# Patient Record
Sex: Female | Born: 1975 | Race: Asian | Hispanic: No | Marital: Married | Smoking: Never smoker
Health system: Southern US, Community
[De-identification: ages and names within clinical notes are randomized; demographics above are authoritative.]

## PROBLEM LIST (undated history)

## (undated) ENCOUNTER — Ambulatory Visit

## (undated) ENCOUNTER — Encounter

## (undated) ENCOUNTER — Encounter: Attending: Diagnostic Radiology | Primary: Diagnostic Radiology

## (undated) ENCOUNTER — Telehealth

## (undated) ENCOUNTER — Ambulatory Visit: Attending: Infectious Disease | Primary: Infectious Disease

## (undated) ENCOUNTER — Telehealth
Attending: Student in an Organized Health Care Education/Training Program | Primary: Student in an Organized Health Care Education/Training Program

## (undated) ENCOUNTER — Ambulatory Visit: Payer: BLUE CROSS/BLUE SHIELD

## (undated) ENCOUNTER — Ambulatory Visit: Attending: Pharmacist | Primary: Pharmacist

## (undated) ENCOUNTER — Ambulatory Visit: Payer: PRIVATE HEALTH INSURANCE

## (undated) ENCOUNTER — Ambulatory Visit: Attending: Family | Primary: Family

## (undated) ENCOUNTER — Ambulatory Visit: Payer: Medicaid (Managed Care) | Attending: Obstetrics & Gynecology | Primary: Obstetrics & Gynecology

## (undated) ENCOUNTER — Encounter: Attending: Obstetrics & Gynecology | Primary: Obstetrics & Gynecology

## (undated) ENCOUNTER — Ambulatory Visit
Payer: BLUE CROSS/BLUE SHIELD | Attending: Student in an Organized Health Care Education/Training Program | Primary: Student in an Organized Health Care Education/Training Program

## (undated) ENCOUNTER — Encounter
Attending: Student in an Organized Health Care Education/Training Program | Primary: Student in an Organized Health Care Education/Training Program

## (undated) ENCOUNTER — Ambulatory Visit: Admission: EM | Payer: BLUE CROSS/BLUE SHIELD | Source: Home / Self Care

## (undated) DIAGNOSIS — B1910 Unspecified viral hepatitis B without hepatic coma: Secondary | ICD-10-CM

## (undated) HISTORY — DX: Unspecified viral hepatitis B without hepatic coma: B19.10

## (undated) HISTORY — PX: UTERINE FIBROID SURGERY: SHX826

---

## 2018-07-26 ENCOUNTER — Encounter: Payer: Self-pay | Admitting: Family Medicine

## 2018-07-26 ENCOUNTER — Ambulatory Visit (INDEPENDENT_AMBULATORY_CARE_PROVIDER_SITE_OTHER): Payer: BLUE CROSS/BLUE SHIELD | Admitting: Family Medicine

## 2018-07-26 ENCOUNTER — Other Ambulatory Visit: Payer: Self-pay

## 2018-07-26 VITALS — BP 100/70 | HR 81 | Temp 98.1°F | Resp 16 | Ht 60.0 in | Wt 115.1 lb

## 2018-07-26 DIAGNOSIS — B181 Chronic viral hepatitis B without delta-agent: Secondary | ICD-10-CM

## 2018-07-26 MED ORDER — ADEFOVIR DIPIVOXIL 10 MG PO TABS: 10.0000 mg | ORAL_TABLET | Freq: Every day | ORAL | 0 refills | Status: DC

## 2018-07-26 NOTE — Progress Notes (Signed)
New Patient Office Visit  Subjective:  Patient ID: Tina Fernandez, female    DOB: 13-Sep-1975  Age: 43 y.o. MRN: [id number]  CC:  Chief Complaint  Patient presents with  . Establish Care  . Hepatitis B    see a Dr. in [place]     HPI Tina Fernandez presents for establish care.   Hep B: She thinks she had Hep B from her mother at birth.  Has been in the [name] for 26 years, originally from [place].  She has been under treatment for Hep B since coming to the [name].  Was seeing her PCP in [place] for treatment - currently taking Hepsera 10mg  daily.  She denies any history of complications with her disease, no abdominal pain, no history or current symptoms of scleral icterus or skin discoloration.    Health Maintenance: We will check with her prior PCP before initiating any additional interventions.   Past Medical History:  Diagnosis Date  . Hepatitis B     Past Surgical History:  Procedure Laterality Date  . UTERINE FIBROID SURGERY      History reviewed. No pertinent family history.  Social History   Socioeconomic History  . Marital status: Married    Spouse name: [name]  . Number of children: 2  . Years of education: Not on file  . Highest education level: Not on file  Occupational History  . Occupation: Self Employed    Comment: Own [employer]  Social Needs  . Financial resource strain: Not hard at all  . Food insecurity:    Worry: Never true    Inability: Never true  . Transportation needs:    Medical: No    Non-medical: No  Tobacco Use  . Smoking status: Never Smoker  . Smokeless tobacco: Never Used  Substance and Sexual Activity  . Alcohol use: Never    Frequency: Never  . Drug use: Never  . Sexual activity: Yes    Partners: Female    Birth control/protection: None  Lifestyle  . Physical activity:    Days per week: 0 days    Minutes per session: 0 [name]  . Stress: Not at all  Relationships  . Social connections:    Talks on phone: More than three times a week   Gets together: More than three times a week    Attends religious service: 1 to 4 times per year    Active member of club or organization: No    Attends meetings of clubs or organizations: Never    Relationship status: Married  . Intimate partner violence:    Fear of current or ex partner: No    Emotionally abused: No    Physically abused: No    Forced sexual activity: No  Other Topics Concern  . Not on file  Social History Narrative  . Not on file    ROS Review of Systems Constitutional: Negative for fever or weight change.  Respiratory: Negative for cough and shortness of breath.   Cardiovascular: Negative for chest pain or palpitations.  Gastrointestinal: Negative for abdominal pain, no bowel changes.  Musculoskeletal: Negative for gait problem or joint swelling.  Skin: Negative for rash.  Neurological: Negative for dizziness or headache.  No other specific complaints in a complete review of systems (except as listed in HPI above).   Objective:   Today's Vitals: BP 100/70 (BP Location: Right Arm, Patient Position: Sitting, Cuff Size: Normal)   Pulse 81   Temp 98.1 F (36.7  C) (Oral)   Resp 16   Ht 5' (1.524 m)   Wt 115 lb 1.6 oz (52.2 kg)   SpO2 99%   BMI 22.48 kg/m   Physical Exam Vitals signs and nursing note reviewed.  Constitutional:      General: She is not in acute distress.    Appearance: She is well-developed.  HENT:     Head: Normocephalic and atraumatic.     Right Ear: External ear normal.     Left Ear: External ear normal.     Nose: Nose normal.     Mouth/Throat:     Pharynx: No oropharyngeal exudate.  Eyes:     Conjunctiva/sclera: Conjunctivae normal.     Pupils: Pupils are equal, round, and reactive to light.  Neck:     Musculoskeletal: Normal range of motion and neck supple.     Vascular: No JVD.  Cardiovascular:     Rate and Rhythm: Normal rate and regular rhythm.     Heart sounds: Normal heart sounds.  Pulmonary:     Effort: Pulmonary  effort is normal.     Breath sounds: Normal breath sounds.  Abdominal:     General: Bowel sounds are normal.     Palpations: Abdomen is soft. There is no hepatomegaly, splenomegaly or mass.     Tenderness: There is no abdominal tenderness.  Musculoskeletal: Normal range of motion.        General: No tenderness.  Skin:    General: Skin is warm and dry.     Findings: No rash.  Neurological:     Mental Status: She is alert and oriented to person, place, and time.     Cranial Nerves: No cranial nerve deficit.  Psychiatric:        Behavior: Behavior normal.        Thought Content: Thought content normal.        Judgment: Judgment normal.    Assessment & Plan:   Problem List Items Addressed This Visit      Digestive   Chronic hepatitis B (HCC) - Primary    We will obtain records/labs and determine course from there.  May need GI referral.      Relevant Medications   adefovir (HEPSERA) 10 MG tablet      Outpatient Encounter Medications as of 07/26/2018  Medication Sig  . adefovir (HEPSERA) 10 MG tablet Take 1 tablet (10 mg total) by mouth daily.  . [DISCONTINUED] adefovir (HEPSERA) 10 MG tablet   . [DISCONTINUED] adefovir (HEPSERA) 10 MG tablet Take 1 tablet (10 mg total) by mouth daily.   No facility-administered encounter medications on file as of 07/26/2018.    Follow-up: Return in about 3 months (around 10/24/2018).   Doren Custard, FNP

## 2018-07-26 NOTE — Assessment & Plan Note (Addendum)
We will obtain records/labs and determine course from there.  May need GI referral.  States is due for labs, but will await PCP update to know which labs she is due for.

## 2018-08-09 ENCOUNTER — Telehealth: Payer: Self-pay | Admitting: Family Medicine

## 2018-08-09 ENCOUNTER — Encounter: Payer: Self-pay | Admitting: Family Medicine

## 2018-08-09 DIAGNOSIS — B181 Chronic viral hepatitis B without delta-agent: Secondary | ICD-10-CM

## 2018-08-09 NOTE — Telephone Encounter (Signed)
Please let Tina Fernandez know that I received her records, and I have reviewed them.  I recommend she see the GI doctor for evaluation to ensure she has the proper follow up on her chronic Hep B.  If they clear her for primary care follow up, I am happy to see her ongoing.

## 2018-08-09 NOTE — Telephone Encounter (Signed)
I tired to contact this patient to review Emily's message but there was no answer. A message was left on her voicemail and I asked her to give our office a call if she had any additional questions.

## 2018-08-12 DIAGNOSIS — B181 Chronic viral hepatitis B without delta-agent: Principal | ICD-10-CM

## 2018-08-27 ENCOUNTER — Other Ambulatory Visit: Payer: Self-pay | Admitting: Emergency Medicine

## 2018-08-27 DIAGNOSIS — B181 Chronic viral hepatitis B without delta-agent: Secondary | ICD-10-CM

## 2018-08-27 MED ORDER — ADEFOVIR DIPIVOXIL 10 MG PO TABS: 10.0000 mg | ORAL_TABLET | Freq: Every day | ORAL | 0 refills | Status: DC

## 2018-08-27 NOTE — Telephone Encounter (Signed)
Please check on GI referral.

## 2018-09-29 ENCOUNTER — Telehealth: Payer: Self-pay | Admitting: Emergency Medicine

## 2018-09-29 DIAGNOSIS — B181 Chronic viral hepatitis B without delta-agent: Secondary | ICD-10-CM

## 2018-09-29 MED ORDER — ADEFOVIR DIPIVOXIL 10 MG PO TABS: 10.0000 mg | ORAL_TABLET | Freq: Every day | ORAL | 0 refills | Status: AC

## 2018-09-29 NOTE — Telephone Encounter (Signed)
Spoke to [place] at Referral

## 2018-09-29 NOTE — Telephone Encounter (Signed)
Rx sent, but needs GI referral for evaluation

## 2018-10-22 ENCOUNTER — Other Ambulatory Visit: Payer: Self-pay | Admitting: Internal Medicine

## 2018-10-22 ENCOUNTER — Other Ambulatory Visit: Payer: Self-pay

## 2018-10-22 DIAGNOSIS — B181 Chronic viral hepatitis B without delta-agent: Secondary | ICD-10-CM

## 2018-10-24 NOTE — Telephone Encounter (Signed)
Pt was referred to GI and was sent to Dr. Markham Jordan.  Please contact his office to find out if they are taking over care of this medication.  Please also request their most recent visit with her. Thanks!

## 2018-11-01 NOTE — Telephone Encounter (Signed)
Please check on referral for this patient

## 2018-11-02 NOTE — Telephone Encounter (Signed)
She saw Dr. Norma Fredrickson on 10/21/2018. Please review records in [facility]

## 2018-11-08 ENCOUNTER — Ambulatory Visit: Payer: BLUE CROSS/BLUE SHIELD

## 2019-09-05 ENCOUNTER — Ambulatory Visit: Payer: BLUE CROSS/BLUE SHIELD | Attending: Internal Medicine

## 2019-09-05 DIAGNOSIS — Z23 Encounter for immunization: Secondary | ICD-10-CM

## 2019-09-05 NOTE — Progress Notes (Signed)
   Covid-19 Vaccination Clinic  Name:  Tina Fernandez    MRN: [id number] DOB: Oct 19, 1975  09/05/2019  [name] was observed post Covid-19 immunization for 15 minutes without incident. She was provided with Vaccine Information Sheet and instruction to access the V-Safe system.   [name] was instructed to call 911 with any severe reactions post vaccine: Marland Kitchen Difficulty breathing  . Swelling of face and throat  . A fast heartbeat  . A bad rash all over body  . Dizziness and weakness   Immunizations Administered    Name Date Dose VIS Date Route   Pfizer COVID-19 Vaccine 09/05/2019  2:38 PM 0.3 mL 05/13/2019 Intramuscular   Manufacturer: [employer], [employer]   Lot: [id number]   NDC: [address]-2

## 2019-09-13 ENCOUNTER — Other Ambulatory Visit: Payer: Self-pay | Admitting: Infectious Diseases

## 2019-09-13 DIAGNOSIS — B181 Chronic viral hepatitis B without delta-agent: Secondary | ICD-10-CM

## 2019-09-20 ENCOUNTER — Ambulatory Visit
Admission: RE | Admit: 2019-09-20 | Discharge: 2019-09-20 | Disposition: A | Discharge status: Home / Self Care | Payer: BLUE CROSS/BLUE SHIELD | Source: Ambulatory Visit | Attending: Infectious Diseases | Admitting: Infectious Diseases

## 2019-09-20 ENCOUNTER — Other Ambulatory Visit: Payer: Self-pay

## 2019-09-20 DIAGNOSIS — B181 Chronic viral hepatitis B without delta-agent: Secondary | ICD-10-CM | POA: Insufficient documentation

## 2019-09-28 ENCOUNTER — Ambulatory Visit: Payer: BLUE CROSS/BLUE SHIELD | Attending: Internal Medicine

## 2019-09-28 DIAGNOSIS — Z23 Encounter for immunization: Secondary | ICD-10-CM

## 2019-09-28 NOTE — Progress Notes (Signed)
   Covid-19 Vaccination Clinic  Name:  Grecia Lynk    MRN: [id number] DOB: 09/10/1975  09/28/2019  Ms. Timme was observed post Covid-19 immunization for 15 minutes without incident. She was provided with Vaccine Information Sheet and instruction to access the V-Safe system.   Ms. Bound was instructed to call 911 with any severe reactions post vaccine: Marland Kitchen Difficulty breathing  . Swelling of face and throat  . A fast heartbeat  . A bad rash all over body  . Dizziness and weakness   Immunizations Administered    Name Date Dose VIS Date Route   Pfizer COVID-19 Vaccine 09/28/2019 11:02 AM 0.3 mL 07/27/2018 Intramuscular   Manufacturer: [employer], [employer]   Lot: [id number]   NDC: [address]-8

## 2020-01-13 ENCOUNTER — Encounter
Admit: 2020-01-13 | Discharge: 2020-01-14 | Payer: PRIVATE HEALTH INSURANCE | Attending: Dermatology | Primary: Dermatology

## 2020-01-13 DIAGNOSIS — L309 Dermatitis, unspecified: Principal | ICD-10-CM

## 2020-01-13 MED ORDER — TRIAMCINOLONE ACETONIDE 0.1 % TOPICAL CREAM
Freq: Two times a day (BID) | TOPICAL | 1 refills | 0.00000 days | Status: CP
Start: 2020-01-13 — End: ?

## 2020-01-17 DIAGNOSIS — L209 Atopic dermatitis, unspecified: Principal | ICD-10-CM

## 2020-06-11 ENCOUNTER — Ambulatory Visit: Payer: BLUE CROSS/BLUE SHIELD | Attending: Internal Medicine

## 2020-06-11 DIAGNOSIS — Z23 Encounter for immunization: Secondary | ICD-10-CM

## 2020-06-11 NOTE — Progress Notes (Signed)
   Covid-19 Vaccination Clinic  Name:  Tina Fernandez    MRN: [id number] DOB: 01/28/1976  06/11/2020  [name] was observed post Covid-19 immunization for 15 minutes without incident. She was provided with Vaccine Information Sheet and instruction to access the V-Safe system.   [name] was instructed to call 911 with any severe reactions post vaccine: Marland Kitchen Difficulty breathing  . Swelling of face and throat  . A fast heartbeat  . A bad rash all over body  . Dizziness and weakness   Immunizations Administered    Name Date Dose VIS Date Route   Pfizer COVID-19 Vaccine 06/11/2020  1:26 PM 0.3 mL 03/21/2020 Intramuscular   Manufacturer: [employer], [employer]   Lot: [id number]   NDC: [address]-4

## 2021-11-15 ENCOUNTER — Ambulatory Visit: Admit: 2021-11-15 | Discharge: 2021-11-16 | Payer: PRIVATE HEALTH INSURANCE

## 2021-11-15 DIAGNOSIS — N926 Irregular menstruation, unspecified: Principal | ICD-10-CM

## 2021-11-15 DIAGNOSIS — B181 Chronic viral hepatitis B without delta-agent: Principal | ICD-10-CM

## 2021-11-15 MED ORDER — ADEFOVIR 10 MG TABLET
ORAL_TABLET | Freq: Every day | ORAL | 2 refills | 90 days | Status: CP
Start: 2021-11-15 — End: ?

## 2021-11-26 DIAGNOSIS — B181 Chronic viral hepatitis B without delta-agent: Principal | ICD-10-CM

## 2021-11-26 MED ORDER — ADEFOVIR 10 MG TABLET
ORAL_TABLET | Freq: Every day | ORAL | 2 refills | 90 days | Status: CP
Start: 2021-11-26 — End: ?

## 2021-12-10 IMAGING — US US ABDOMEN LIMITED
1 series · 14 of 25 positions shown · non-contrast
Comparison: No pertinent prior studies available for comparison.

CLINICAL DATA: Chronic hepatitis B (HCC)

EXAM:
ULTRASOUND ABDOMEN LIMITED RIGHT UPPER QUADRANT

[Series 1: us abdomen limited · 0.15mm/px · 14 of 44 slices shown]
[im 1/44]
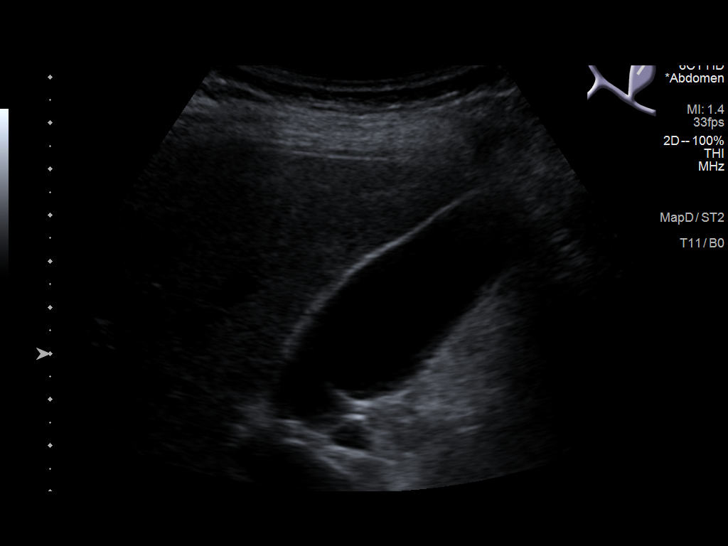
[im 4/44]
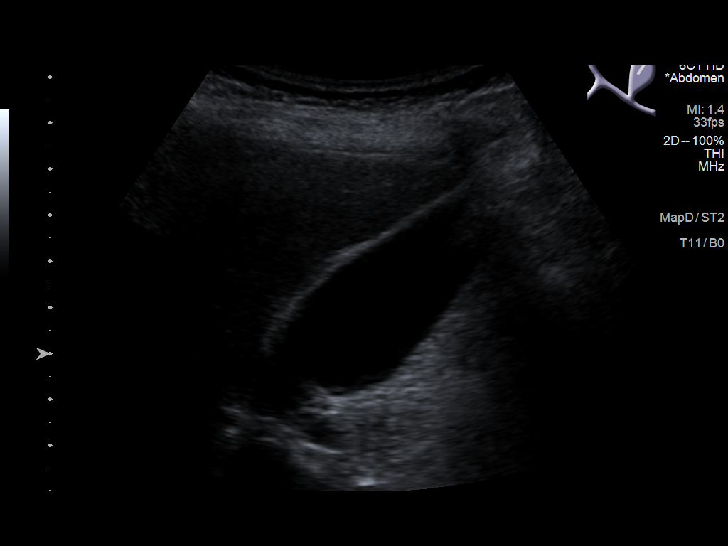
[im 8/44]
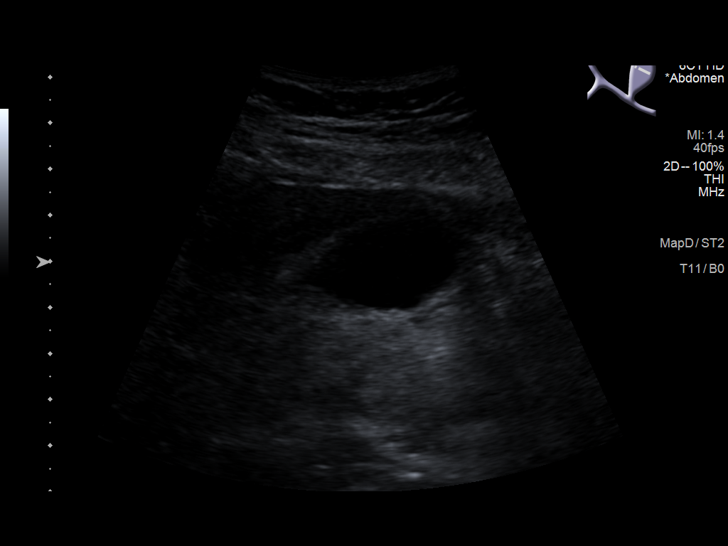
[im 11/44]
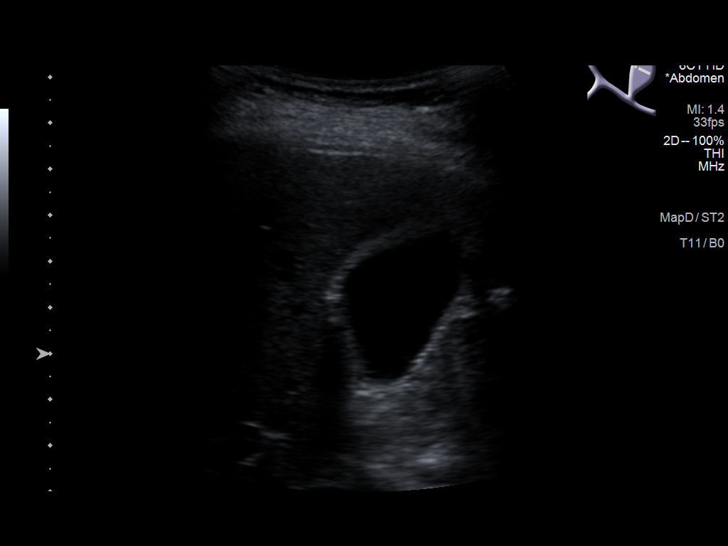
[im 15/44]
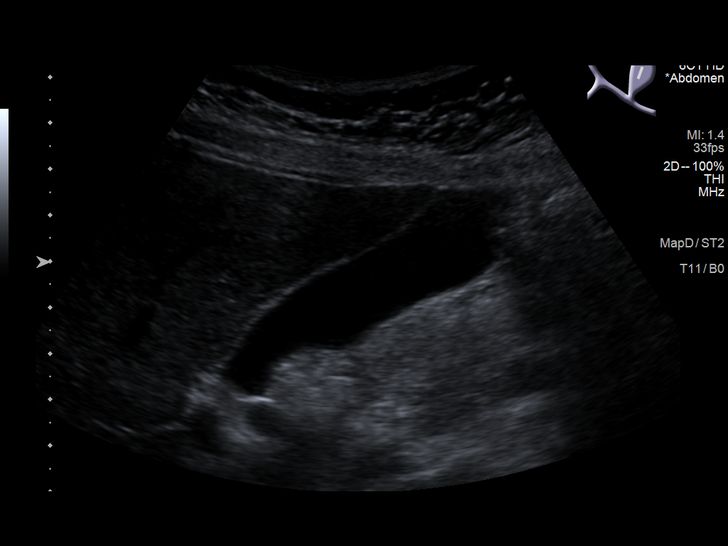
[im 17/44]
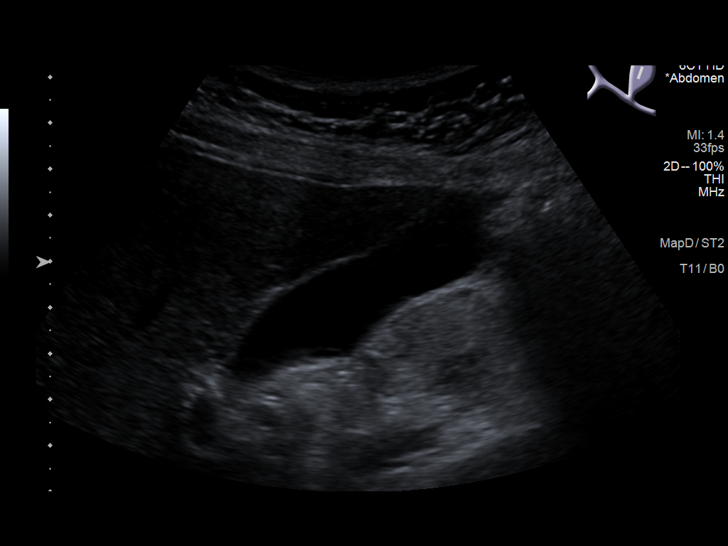
[im 20/44]
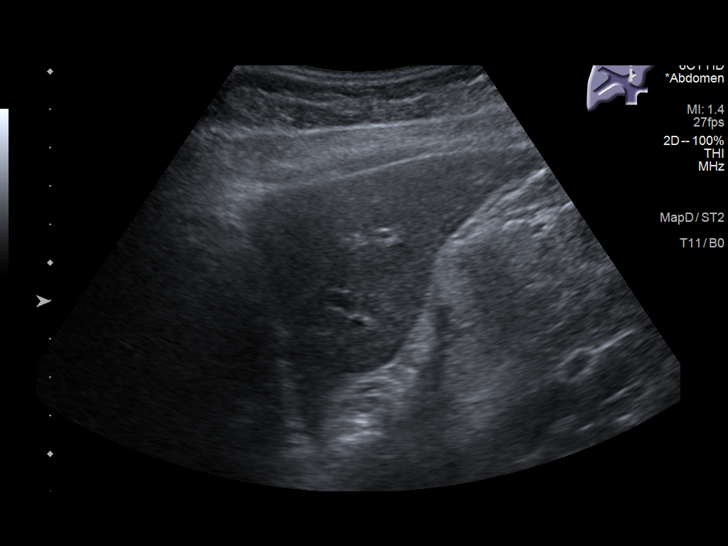
[im 24/44]
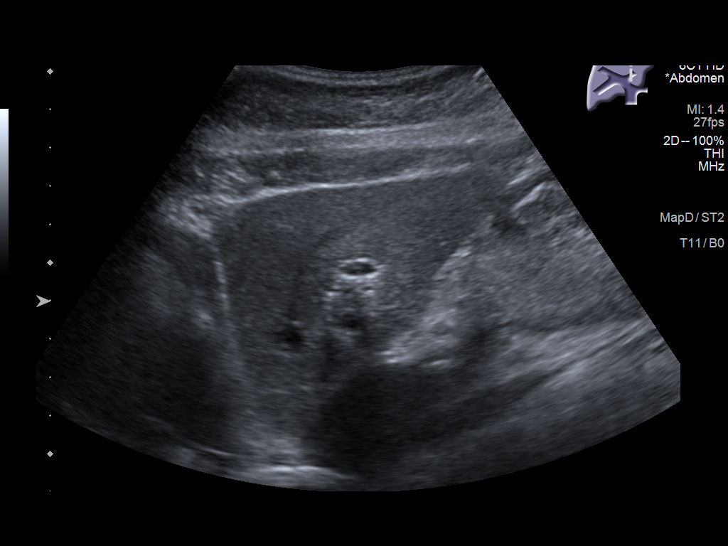
[im 27/44]
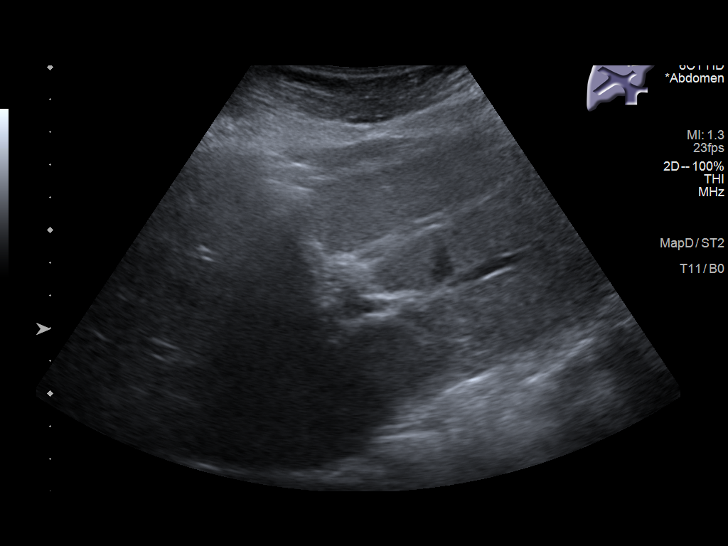
[im 29/44]
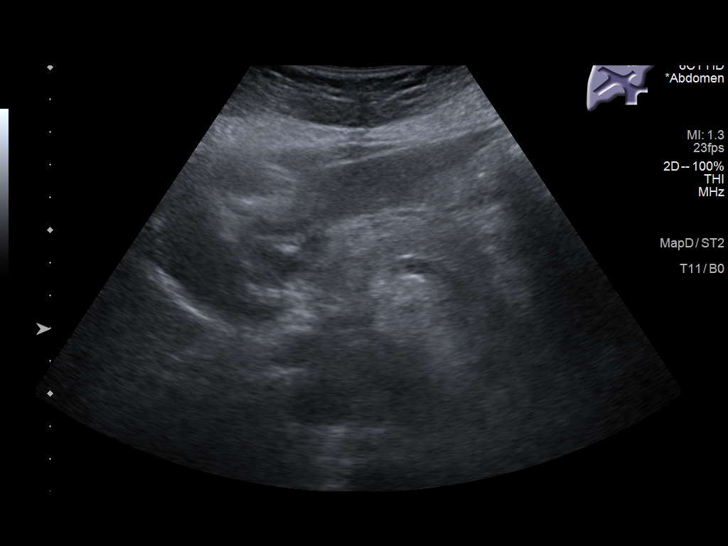
[im 33/44]
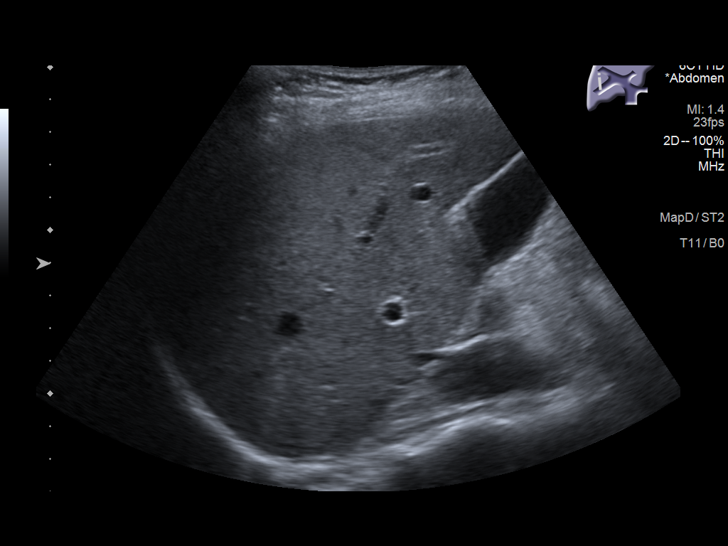
[im 36/44]
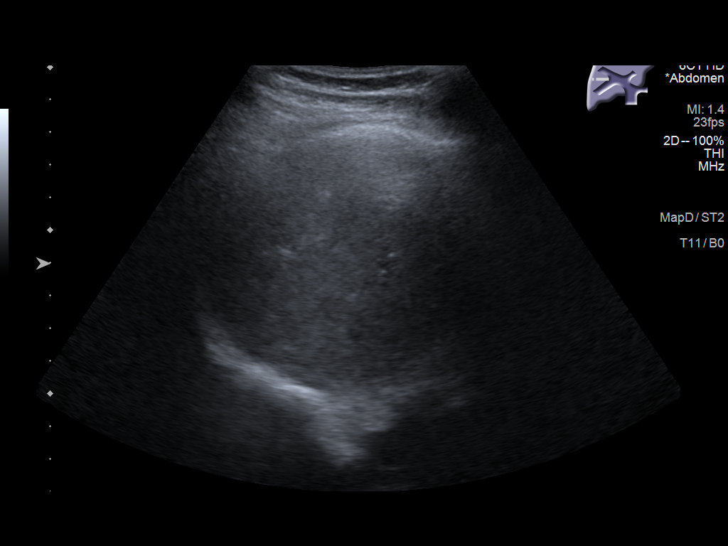
[im 40/44]
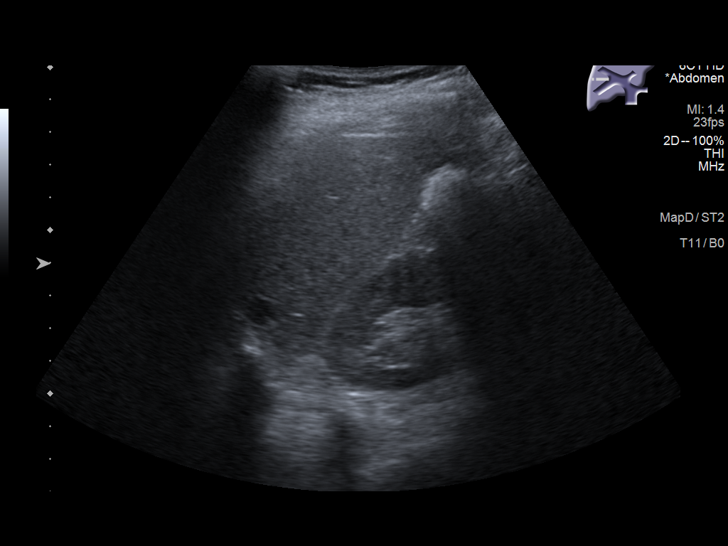
[im 44/44]
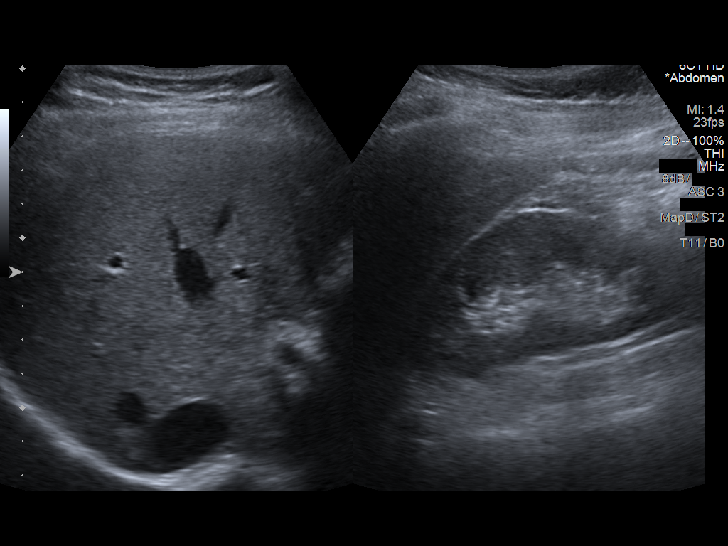

[14 of 25 positions shown; findings below may reference images not displayed]

FINDINGS: Gallbladder:

No gallstones or wall thickening visualized. No sonographic Murphy
sign noted by sonographer.

Common bile duct:

Diameter: 2 mm, within normal limits.

Liver:

1.0 x 0.8 x 1.0 cm hypoechoic lesion within the right hepatic lobe
which appears to reflect a simple cyst. No other focal hepatic
lesion is identified. Background hepatic parenchymal echogenicity is
within normal limits. Portal vein is patent on color Doppler imaging
with normal direction of blood flow towards the liver.
IMPRESSION: 1 cm probable cyst within the right hepatic lobe.

Otherwise unremarkable right upper quadrant ultrasound as described.
No other focal hepatic lesion is identified.

## 2022-01-20 ENCOUNTER — Ambulatory Visit
Admit: 2022-01-20 | Discharge: 2022-01-21 | Payer: PRIVATE HEALTH INSURANCE | Attending: Student in an Organized Health Care Education/Training Program | Primary: Student in an Organized Health Care Education/Training Program

## 2022-01-20 DIAGNOSIS — B181 Chronic viral hepatitis B without delta-agent: Principal | ICD-10-CM

## 2022-01-22 ENCOUNTER — Ambulatory Visit: Admit: 2022-01-22 | Discharge: 2022-01-22 | Payer: PRIVATE HEALTH INSURANCE

## 2022-01-23 DIAGNOSIS — M8580 Other specified disorders of bone density and structure, unspecified site: Principal | ICD-10-CM

## 2022-01-23 DIAGNOSIS — B181 Chronic viral hepatitis B without delta-agent: Principal | ICD-10-CM

## 2022-01-23 MED ORDER — TENOFOVIR ALAFENAMIDE 25 MG TABLET
ORAL_TABLET | Freq: Every day | ORAL | 3 refills | 90 days | Status: CP
Start: 2022-01-23 — End: 2022-04-23
  Filled 2022-01-28: qty 30, 30d supply, fill #0

## 2022-01-24 DIAGNOSIS — B181 Chronic viral hepatitis B without delta-agent: Principal | ICD-10-CM

## 2022-01-24 LAB — HEPATITIS DELTA VIRUS ANTIBODY: HEPATITIS D ANTIBODY, TOTAL: NEGATIVE

## 2022-01-24 NOTE — Unmapped (Signed)
[facility] Specialty Medication Onboarding    Specialty Medication: VEMLIDY 25 mg Tab tablet (tenofovir alafenamide)  Prior Authorization: Not Required   Financial Assistance: Yes - copay card approved as secondary   Final Copay/Day Supply: $0 / 30    Insurance Restrictions: Yes - max 1 month supply     Notes to Pharmacist:     The triage team has completed the benefits investigation and has determined that the patient is able to fill this medication at [facility]. Please contact the patient to complete the onboarding or follow up with the prescribing physician as needed.

## 2022-01-27 NOTE — Unmapped (Incomplete)
**INCOMPLETE. WAITING FOR PATIENT TO [facility]   Patient Onboarding/Medication Counseling    Alison Clark is a 46 y.o. female with Hepatitis B who I am counseling today on initiation of therapy.  I am speaking to {Blank:[id number]::the patient,the patient's caregiver, ***,the patient's family member, ***,***}.    Was a Nurse, learning disability used for this call? No    Verified patient's date of birth / HIPAA.    Specialty medication(s) to be sent: Infectious Disease: Vemlidy      Non-specialty medications/supplies to be sent: n/a      Medications not needed at this time: n/a         Vemlidy (tenofovir alafenamide) 25mg     Medication & Administration     Dosage: Take one tablet by mouth daily    Administration: Take with food    Adherence/Missed dose instructions: take missed dose as soon as you remember. If it is close to the time of your next dose, skip the dose and resume with your next scheduled dose. Avoid missing doses as it can result in development of resistance.    Goals of Therapy     To keep HBV DNA levels undetectable  To decrease the morbidity and mortality related to chronic hepatitis B.    Side Effects & Monitoring Parameters     Headache  Abdominal pain.  Fatigue  Cough.   Upset stomach.   Diarrhea.    The following side effects should be reported to the provider:    Signs of an allergic reaction, like rash; hives; itching; red, swollen, blistered, or peeling skin with or without fever; wheezing; tightness in the chest or throat; trouble breathing, swallowing, or talking; unusual hoarseness; or swelling of the mouth, face, lips, tongue, or throat.   Signs of kidney problems like unable to pass urine, change in how much urine is passed, blood in the urine, or a big weight gain.  Signs of liver problems like dark urine, feeling tired, not hungry, upset stomach or stomach pain, light-colored stools, throwing up, or yellow skin or eyes.  Signs of too much lactic acid in the blood (lactic acidosis) like fast breathing, fast heartbeat, a heartbeat that does not feel normal, very bad upset stomach or throwing up, feeling very sleepy, shortness of breath, feeling very tired or weak, very bad dizziness, feeling cold, or muscle pain or cramps    Monitoring Parameters:     HBV e antigen, HBV e antibody, and HBV DNA: every 3-6 months or as clinically indicated  HBV surface antigen, HBV surface Antibody: as clinically indicated   INR: every 3 to 6 months as clinically indicated  Serum creatinine, serum phosphorus, urine glucose, and urine protein prior to initiation of therapy and as clinically indicated during therapy.  Perform HIV testing prior to initiation.   Obtain liver function tests (AST, ALT, Alk Phos, albumin, and bilirubin) as clinically indicated    Contraindications, Warnings, & Precautions     Lactic acidosis/hepatomegaly: Lactic acidosis and severe hepatomegaly with steatosis, sometimes fatal, have been reported with the use of nucleoside analogs, alone or in combination with other antiretrovirals. Suspend treatment in any patient who develops clinical or laboratory findings suggestive of lactic acidosis or pronounced hepatotoxicity (marked transaminase elevation may/may not accompany hepatomegaly and steatosis).  Hepatic impairment: Use is not recommended in patients with Child-Pugh class B or C hepatic impairment.  Hepatitis B acute exacerbation: [US Boxed Warning]: Discontinuation of anti-hepatitis B therapy may result in severe acute exacerbations  of hepatitis B. Monitor clinical and laboratory data closely for several months after treatment discontinuation. If clinically indicated, anti-hepatitis B therapy may be resumed.  HIV-1 and HBV coinfection: Should not be used as a single agent for the treatment of HIV-1 due to resistance development risk.  Renal impairment: Use is not recommended in patients with CrCl <15 mL/minute who are not receiving hemodialysis.  Renal toxicity: Cases of acute renal failure and/or Fanconi syndrome have been reported with use of tenofovir prodrugs; patients with preexisting renal impairment and those taking nephrotoxic agents (including NSAIDs) are at increased risk. Prior to initiation of therapy and during therapy, assess serum creatinine, estimated CrCl, urine protein, and urine glucose in all patients as clinically appropriate; also assess serum phosphorus in patients with chronic kidney disease. Discontinue therapy in patients that develop clinically significant decreases in renal function or evidence of Fanconi syndrome.    Drug/Food Interactions     Medication list reviewed in Epic. The patient was instructed to inform the care team before taking any new medications or supplements. No drug interactions identified.      Storage, Handling Precautions, & Disposal     Store this medication at room temperature.  Store in the original container   Keep lid tightly closed.  Store in a dry place. Do not store in a bathroom.  Keep all drugs in a safe place. Keep all drugs out of the reach of children and pets.  Throw away unused or expired drugs. Do not flush down a toilet or pour down a drain unless you are told to do so. Check with your pharmacist if you have questions about the best way to throw out drugs. There may be drug take-back programs in your area.      Current Medications (including OTC/herbals), Comorbidities and Allergies     Current Outpatient Medications   Medication Sig Dispense Refill    adefovir (HEPSERA) 10 MG tablet Take 1 tablet (10 mg total) by mouth daily. 90 tablet 2    tenofovir alafenamide (VEMLIDY) 25 mg Tab tablet Take 1 tablet (25 mg total) by mouth daily. Take with food. 90 tablet 3    triamcinolone (KENALOG) 0.1 % cream Apply topically Two (2) times a day. To rash on neck until smooth/clear. (Patient not taking: Reported on 11/15/2021) 80 g 1     No current facility-administered medications for this visit.       No Known Component Value Date/Time    HBSAG (A) 01/20/2022 11:24 AM     For final results see Hepatitis B Surface Antigen Neutralization    HEPBSAB Nonreactive 01/20/2022 11:24 AM    ALT 11 01/20/2022 11:23 AM    ALT 10 11/15/2021 02:58 PM    AST 14 01/20/2022 11:23 AM    CREATININE 0.58 (L) 01/20/2022 11:23 AM    CREATININE 0.59 (L) 11/15/2021 02:58 PM         Appropriateness of Therapy     Acute infections noted within Epic:  No active infections  Patient reported infection: None    Is medication and dose appropriate based on diagnosis and infection status? Yes    Prescription has been clinically reviewed: Yes      Baseline Quality of Life Assessment      How many days over the past month did your Hepatitis B  keep you from your normal activities? For example, brushing your teeth or getting up in the morning. 0    Financial Information     Medication Assistance  provided: Copay Assistance    Anticipated copay of $0.00 reviewed with patient. Verified delivery address.    Delivery Information     Scheduled delivery date: 01/28/22    Expected start date: 01/28/22    Medication will be delivered via Same Day Courier to the prescription address in [facility].  This shipment will not require a signature.      Explained the services we provide at [facility] and that each month we would call to set up refills.  Stressed importance of returning phone calls so that we could ensure they receive their medications in time each month.  Informed patient that we should be setting up refills 7-10 days prior to when they will run out of medication.  A pharmacist will reach out to perform a clinical assessment periodically.  Informed patient that a welcome packet, containing information about our pharmacy and other support services, a Notice of Privacy Practices, and a drug information handout will be sent.      The patient or caregiver noted above participated in the development of this care plan and knows that they can request review of or adjustments to the care plan at any time.      Patient or caregiver verbalized understanding of the above information as well as how to contact the pharmacy at [phone] option 4 with any questions/concerns.  The pharmacy is open Monday through Friday 8:30am-4:30pm.  A pharmacist is available 24/7 via pager to answer any clinical questions they may have.    Patient Specific Needs     Does the patient have any physical, cognitive, or cultural barriers? No    Does the patient have adequate living arrangements? (i.e. the ability to store and take their medication appropriately) Yes    Did you identify any home environmental safety or security hazards? No    Patient prefers to have medications discussed with   either herself or her husband      Is the patient or caregiver able to read and understand education materials at a high school level or above? Yes    Patient's primary language is  English     Is the patient high risk? No    SOCIAL DETERMINANTS OF HEALTH     At the [facility], we have learned that life circumstances - like trouble affording food, housing, utilities, or transportation can affect the health of many of our patients.   That is why we wanted to ask: are you currently experiencing any life circumstances that are negatively impacting your health and/or quality of life? Patient declined to answer    Social Determinants of Health     Financial Resource Strain: Not on file   Internet Connectivity: Not on file   Food Insecurity: Not on file   Tobacco Use: Low Risk  (01/22/2022)    Patient History     Smoking Tobacco Use: Never     Smokeless Tobacco Use: Never     Passive Exposure: Not on file   Housing/Utilities: Not on file   Alcohol Use: Not on file   Transportation Needs: Not on file   Substance Use: Not on file   Health Literacy: Not on file   Physical Activity: Not on file   Interpersonal Safety: Not on file   Stress: Not on file   Intimate Partner Violence: Not on file Transportation Needs: Not on file   Substance Use: Not on file   Health Literacy: Not on file  Physical Activity: Not on file   Interpersonal Safety: Not on file   Stress: Not on file   Intimate Partner Violence: Not on file   Depression: Not at risk (11/15/2021)    PHQ-2     PHQ-2 Score: 0   Social Connections: Not on file       Would you be willing to receive help with any of the needs that you have identified today? {Yes/No/Not applicable:93005}       Roderic Palau  [facility] Specialty Pharmacist

## 2022-01-29 LAB — HEPATITIS B DNA, QUANTITATIVE, PCR
HBV DNA QUANT: DETECTED — AB
HBV DNA: <10 [IU]/mL — ABNORMAL HIGH

## 2022-02-18 ENCOUNTER — Ambulatory Visit: Admit: 2022-02-18 | Discharge: 2022-02-19 | Payer: PRIVATE HEALTH INSURANCE

## 2022-02-18 DIAGNOSIS — Z23 Encounter for immunization: Principal | ICD-10-CM

## 2022-02-18 DIAGNOSIS — R030 Elevated blood-pressure reading, without diagnosis of hypertension: Principal | ICD-10-CM

## 2022-02-18 DIAGNOSIS — Z1231 Encounter for screening mammogram for malignant neoplasm of breast: Principal | ICD-10-CM

## 2022-02-18 DIAGNOSIS — M8588 Other specified disorders of bone density and structure, other site: Principal | ICD-10-CM

## 2022-02-18 DIAGNOSIS — Z124 Encounter for screening for malignant neoplasm of cervix: Principal | ICD-10-CM

## 2022-02-18 DIAGNOSIS — Z1159 Encounter for screening for other viral diseases: Principal | ICD-10-CM

## 2022-02-18 DIAGNOSIS — R718 Other abnormality of red blood cells: Principal | ICD-10-CM

## 2022-02-18 DIAGNOSIS — H7392 Unspecified disorder of tympanic membrane, left ear: Principal | ICD-10-CM

## 2022-02-18 DIAGNOSIS — Z0000 Encounter for general adult medical examination without abnormal findings: Principal | ICD-10-CM

## 2022-02-18 DIAGNOSIS — E559 Vitamin D deficiency, unspecified: Principal | ICD-10-CM

## 2022-02-18 DIAGNOSIS — Z2821 Immunization not carried out because of patient refusal: Principal | ICD-10-CM

## 2022-02-18 LAB — COMPREHENSIVE METABOLIC PANEL
ALBUMIN: 4.3 g/dL (ref 3.4–5.0)
ALKALINE PHOSPHATASE: 84 U/L (ref 46–116)
ALT (SGPT): 18 U/L (ref 10–49)
ANION GAP: 5 mmol/L (ref 5–14)
AST (SGOT): 15 U/L (ref <=34)
BILIRUBIN TOTAL: 0.7 mg/dL (ref 0.3–1.2)
BLOOD UREA NITROGEN: 14 mg/dL (ref 9–23)
BUN / CREAT RATIO: 20
CALCIUM: 9.4 mg/dL (ref 8.7–10.4)
CHLORIDE: 108 mmol/L — ABNORMAL HIGH (ref 98–107)
CO2: 29.4 mmol/L (ref 20.0–31.0)
CREATININE: 0.71 mg/dL
EGFR CKD-EPI (2021) FEMALE: >90 mL/min/{1.73_m2} (ref >=60)
GLUCOSE RANDOM: 92 mg/dL (ref 70–99)
POTASSIUM: 3.8 mmol/L (ref 3.4–4.8)
PROTEIN TOTAL: 7.3 g/dL (ref 5.7–8.2)
SODIUM: 142 mmol/L (ref 135–145)

## 2022-02-18 LAB — CBC W/ AUTO DIFF
BASOPHILS ABSOLUTE COUNT: 0 10*9/L (ref 0.0–0.1)
BASOPHILS RELATIVE PERCENT: 0.9 %
EOSINOPHILS ABSOLUTE COUNT: 0 10*9/L (ref 0.0–0.5)
EOSINOPHILS RELATIVE PERCENT: 0.9 %
HEMATOCRIT: 39.7 % (ref 34.0–44.0)
HEMOGLOBIN: 12.9 g/dL (ref 11.3–14.9)
LYMPHOCYTES ABSOLUTE COUNT: 1 10*9/L — ABNORMAL LOW (ref 1.1–3.6)
LYMPHOCYTES RELATIVE PERCENT: 20.4 %
MEAN CORPUSCULAR HEMOGLOBIN CONC: 32.5 g/dL (ref 32.0–36.0)
MEAN CORPUSCULAR HEMOGLOBIN: 25.1 pg — ABNORMAL LOW (ref 25.9–32.4)
MEAN CORPUSCULAR VOLUME: 77.1 fL — ABNORMAL LOW (ref 77.6–95.7)
MEAN PLATELET VOLUME: 8.4 fL (ref 6.8–10.7)
MONOCYTES ABSOLUTE COUNT: 0.3 10*9/L (ref 0.3–0.8)
MONOCYTES RELATIVE PERCENT: 5.7 %
NEUTROPHILS ABSOLUTE COUNT: 3.5 10*9/L (ref 1.8–7.8)
NEUTROPHILS RELATIVE PERCENT: 72.1 %
NUCLEATED RED BLOOD CELLS: 0 /100{WBCs} (ref <=4)
PLATELET COUNT: 280 10*9/L (ref 150–450)
RED BLOOD CELL COUNT: 5.15 10*12/L — ABNORMAL HIGH (ref 3.95–5.13)
RED CELL DISTRIBUTION WIDTH: 13.5 % (ref 12.2–15.2)
WBC ADJUSTED: 4.9 10*9/L (ref 3.6–11.2)

## 2022-02-18 LAB — LIPID PANEL
CHOLESTEROL/HDL RATIO SCREEN: 2.6 (ref 1.0–4.5)
CHOLESTEROL: 186 mg/dL (ref <=200)
HDL CHOLESTEROL: 72 mg/dL — ABNORMAL HIGH (ref 40–60)
LDL CHOLESTEROL CALCULATED: 103 mg/dL — ABNORMAL HIGH (ref 40–99)
NON-HDL CHOLESTEROL: 114 mg/dL (ref 70–130)
TRIGLYCERIDES: 55 mg/dL (ref 0–150)
VLDL CHOLESTEROL CAL: 11 mg/dL (ref 9–37)

## 2022-02-18 LAB — IRON & TIBC
IRON SATURATION (CALC): 20 %
IRON: 67 ug/dL
TOTAL IRON BINDING CAPACITY (CALC): 340.2 ug/dL (ref 250.0–425.0)
TRANSFERRIN: 270 mg/dL

## 2022-02-18 LAB — FERRITIN: FERRITIN: 27.1 ng/mL

## 2022-02-18 NOTE — Unmapped (Signed)
[facility] at [facility] Visit    Patient ID: Alison Clark is a 46 y.o. female who is here for:  Annual Exam      Assessment/Plan:       Diagnosis ICD-10-CM Associated Orders   1. Annual physical exam  Z00.00 Comprehensive Metabolic Panel     Lipid Panel     CBC w/ Differential      2. Breast cancer screening by mammogram  Z12.31 Mammo Digital Screening W Tomo Bilateral      3. Pap smear for cervical cancer screening  Z12.4 Ambulatory referral to Gynecology      4. Need for hepatitis C screening test  Z11.59 Hepatitis C Antibody      5. Osteopenia of lumbar spine  M85.88       6. Microcytosis  R71.8 Iron & TIBC     Ferritin      7. Need for vaccination  Z23 Tdap vaccine greater than or equal to 7yo IM      8. Vitamin D deficiency  E55.9       9. Influenza vaccination declined  Z28.21           Vital signs normal.  Physical exam is unremarkable.     Above lab work ordered for patient.    Patient was advised that she is due for lipid screening.  We also discussed mammogram today.  Patient is due for her Pap smear but patient would prefer to do this with female provider.  Referral to GYN given.  Patient declines flu vaccination today.  Patient is open to Tdap booster.  Patient was encouraged to get her COVID booster when available.    Encouraged healthy diet and regular exercise.    Patient's previous lab work showed a microcytosis.  We will evaluate for iron deficiency today.    Patient's history of hepatitis B medication has possibly increased her risk of osteoporosis and she is now showing signs of osteopenia.  Patient's previous vitamin D level was low.  We will start with over-the-counter vitamin D3 5000 units daily.  Patient is not currently on vitamin D supplementation.  We will check calcium levels today.    Patient's blood pressure here in the office is borderline.  Patient feels like her blood pressure is better at home.  Patient was advised to monitor her blood pressure at home as she already has a blood pressure cuff and to call us if her blood pressures average greater than 135 systolic.    On exam patient has some scarring and abnormalities of her left tympanic membrane.  Patient reports that she had some ear problems as a child but now she has no issues and she was told that everything was okay.  Patient has no hearing loss.       There are no Patient Instructions on file for this visit.      Medication risks and benefits provided to patient.     -- Patient verbalized an understanding of today's assessment and recommendations, as well as the purpose of ongoing medications.    Return in about 1 year (around 02/19/2023) for Annual physical.            Alison Azelia Reiger, DO  [facility] at [place], [place]      Subjective:     HPI:    Patient is annual physical today.  Patient reports that she does not exercise regularly although her job is not sedentary.  Patient does not have  a currently healthy diet as she eats out a lot.    REVIEW OF SYSTEMS:     Review of Systems   Constitutional:  Negative for chills and fever.   HENT:  Negative for congestion and sore throat.    Eyes:  Negative for pain.   Respiratory:  Negative for cough and shortness of breath.    Cardiovascular:  Negative for chest pain.   Gastrointestinal:  Negative for abdominal pain and blood in stool.   Genitourinary:  Negative for dysuria.   Musculoskeletal:  Negative for back pain.   Skin:  Negative for rash.   Neurological:  Negative for weakness, numbness and headaches.   Psychiatric/Behavioral:  Negative for dysphoric mood.         HISTORY:     History was reviewed and electronic chart updated:     Past Medical History:   Diagnosis Date    Chronic hepatitis B (CMS-HCC)     Eczema     Fibroid     ovaries       Outpatient Medications Prior to Visit   Medication Sig Dispense Refill    tenofovir alafenamide (VEMLIDY) 25 mg Tab tablet Take 1 tablet (25 mg total) by mouth daily. Take with food. 90 tablet 3    triamcinolone (KENALOG) 0.1 % cream Apply topically Two (2) times a day. To rash on neck until smooth/clear. (Patient not taking: Reported on 11/15/2021) 80 g 1     No facility-administered medications prior to visit.        No Known Allergies    Social History     Tobacco Use    Smoking status: Never    Smokeless tobacco: Never   Vaping Use    Vaping Use: Never used   Substance and Sexual Activity    Alcohol use: Not Currently    Drug use: Never    Sexual activity: Yes     Partners: Male   Other Topics Concern    Do you use sunscreen? No    Tanning bed use? No    Are you easily burned? No    Excessive sun exposure? No    Blistering sunburns? No     Social Determinants of Health     Food Insecurity: No Food Insecurity (02/18/2022)    Hunger Vital Sign     Worried About Running Out of Food in the Last Year: Never true     Ran Out of Food in the Last Year: Never true       Objective:     Visit Vitals  BP 145/85   Pulse 64   Temp 36.6 ??C (97.9 ??F) (Temporal)   Ht 151.6 cm (4' 11.69)   Wt 49.9 kg (110 lb)   LMP 12/21/2021   SpO2 98%   BMI 21.71 kg/m??       Blood Pressure for the past 24 hrs:   BP   02/18/22 1015 145/85   02/18/22 0955 151/79        PHYSICAL EXAM:    Physical Exam  Vitals and nursing note reviewed.   Constitutional:       General: She is not in acute distress.     Appearance: Normal appearance.   HENT:      Head: Normocephalic and atraumatic.      Right Ear: Tympanic membrane, ear canal and external ear normal.      Left Ear: Ear canal and external ear normal. Tympanic membrane is scarred.  Nose: No congestion.      Mouth/Throat:      Mouth: Mucous membranes are moist. No oral lesions.      Dentition: Normal dentition.      Pharynx: No oropharyngeal exudate or posterior oropharyngeal erythema.      Tonsils: No tonsillar exudate.   Eyes:      General: Lids are normal.         Right eye: No discharge.         Left eye: No discharge.      Conjunctiva/sclera: Conjunctivae normal.      Pupils: Pupils are equal, round, and reactive to light.   Neck:      Thyroid: No thyroid mass or thyromegaly.   Cardiovascular:      Rate and Rhythm: Normal rate and regular rhythm.      Heart sounds: Normal heart sounds.   Pulmonary:      Effort: Pulmonary effort is normal. No respiratory distress.      Breath sounds: Normal breath sounds.   Abdominal:      General: Bowel sounds are normal. There is no distension.      Palpations: Abdomen is soft.      Tenderness: There is no abdominal tenderness.   Musculoskeletal:         General: Normal range of motion.      Cervical back: Normal range of motion and neck supple.      Right lower leg: No edema.      Left lower leg: No edema.   Skin:     General: Skin is warm and dry.      Findings: No rash.   Neurological:      General: No focal deficit present.      Mental Status: She is alert and oriented to person, place, and time.   Psychiatric:         Mood and Affect: Mood normal.         Behavior: Behavior normal.              Note - This record has been created using [employer]. Chart creation errors have been sought, but may not always have been located. Such creation errors do not reflect on the standard of medical care.

## 2022-02-18 NOTE — Unmapped (Addendum)
Over-the-counter Vitamin D3 5000 units daily    Please start taking your blood pressures at home with your own blood pressure cuff. Blood pressure cuffs are available online on [employer] and at most drug/retail stores. It is best to purchase a blood pressure cuff that is automatic and goes on the upper arm. It is usually best to avoid blood pressure cuffs that go on the wrist as they are not as accurate. I want you to take your blood pressure once a day at random times of the day(morning, afternoon, evening, etc). When you take your blood pressure I would like you to sit in a comfortable, low stress environment for about 5 minutes before taking your blood pressure. I want you to log the date and time when you took your BP, the top and bottom numbers of the blood pressure and also the heart rate. Please start taking a log of your blood pressures and bring them with you to your next office visit.       Example:    Date                 Time            Blood Pressure                 Heart Rate    November 30, 2020      8:30am          BP: 125 / 82                            72    December 01, 2020      6:30pm          BP: 123 / 79                            68       Please call the office if your systolic BP (top number) is consistently above 135.    Please call the office if your systolic BP (top number) is consistently below 100.    Please call the office if your heart rate is consistently above 105.    Please call the office if your hear rate is consistently below 50.

## 2022-02-19 LAB — HEPATITIS C ANTIBODY: HEPATITIS C ANTIBODY: NONREACTIVE

## 2022-02-20 NOTE — Unmapped (Signed)
[facility] Clinical Assessment & Refill Coordination Note    Alison Clark, [place]: Jan 12, 1976  Phone: [phone] (home)     All above HIPAA information was verified with patient.     Was a Nurse, learning disability used for this call? No    Specialty Medication(s):   Infectious Disease: Vemlidy     Current Outpatient Medications   Medication Sig Dispense Refill    cholecalciferol, vitamin D3-125 mcg, 5,000 unit,, 125 mcg (5,000 unit) tablet Take 1 tablet (125 mcg total) by mouth daily.      tenofovir alafenamide (VEMLIDY) 25 mg Tab tablet Take 1 tablet (25 mg total) by mouth daily. Take with food. 90 tablet 3     No current facility-administered medications for this visit.        Changes to medications: Alison Clark reports no changes at this time.    No Known Allergies    Changes to allergies: No    SPECIALTY MEDICATION ADHERENCE     Vemlidy 25 mg: approximately 7 days of medicine on hand       Medication Adherence    Patient reported X missed doses in the last month: 0  Specialty Medication: Vemlidy 25mg   Patient is on additional specialty medications: No  Demonstrates understanding of importance of adherence: yes  Informant: patient  Provider-estimated medication adherence level: good       Specialty medication(s) dose(s) confirmed: Regimen is correct and unchanged.     Are there any concerns with adherence? No    Adherence counseling provided? Not needed    CLINICAL MANAGEMENT AND INTERVENTION      Clinical Benefit Assessment:    Do you feel the medicine is effective or helping your condition? Yes    Clinical Benefit counseling provided? Not needed    Adverse Effects Assessment:    Are you experiencing any side effects? No    Are you experiencing difficulty administering your medicine? No    Quality of Life Assessment:      How many days over the past month did your Hepatitis B  keep you from your normal activities? For example, brushing your teeth or getting up in the morning. 0    Have you discussed this with your provider? Not needed    Acute Infection Status:    Acute infections noted within Epic:  No active infections  Patient reported infection: None    Therapy Appropriateness:    Is therapy appropriate and patient progressing towards therapeutic goals? Yes, therapy is appropriate and should be continued    DISEASE/MEDICATION-SPECIFIC INFORMATION      N/A    PATIENT SPECIFIC NEEDS     Does the patient have any physical, cognitive, or cultural barriers? No    Is the patient high risk? No    Does the patient require a Care Management Plan? No     SOCIAL DETERMINANTS OF HEALTH     At the [facility], we have learned that life circumstances - like trouble affording food, housing, utilities, or transportation can affect the health of many of our patients.   That is why we wanted to ask: are you currently experiencing any life circumstances that are negatively impacting your health and/or quality of life? Patient declined to answer    Social Determinants of Health     Financial Resource Strain: Not on file   Internet Connectivity: Not on file   Food Insecurity: No Food Insecurity (02/18/2022)    Hunger Vital Sign  Worried About Programme researcher, broadcasting/film/video in the Last Year: Never true     Ran Out of Food in the Last Year: Never true   Tobacco Use: Low Risk  (02/18/2022)    Patient History     Smoking Tobacco Use: Never     Smokeless Tobacco Use: Never     Passive Exposure: Not on file   Housing/Utilities: Not on file   Alcohol Use: Not on file   Transportation Needs: Not on file   Substance Use: Not on file   Health Literacy: Low Risk  (02/18/2022)    Health Literacy     : Never   Physical Activity: Not on file   Interpersonal Safety: Not on file   Stress: Not on file   Intimate Partner Violence: Not on file   Depression: Not at risk (11/15/2021)    PHQ-2     PHQ-2 Score: 0   Social Connections: Not on file       Would you be willing to receive help with any of the needs that you have identified today? Not applicable SHIPPING     Specialty Medication(s) to be Shipped:   Infectious Disease: Vemlidy    Other medication(s) to be shipped: No additional medications requested for fill at this time     Changes to insurance: No    Delivery Scheduled: Yes, Expected medication delivery date: 02/21/22.     Medication will be delivered via Same Day Courier to the confirmed prescription address in [facility].    The patient will receive a drug information handout for each medication shipped and additional FDA Medication Guides as required.  Verified that patient has previously received a Conservation officer, historic buildings and a Surveyor, mining.    The patient or caregiver noted above participated in the development of this care plan and knows that they can request review of or adjustments to the care plan at any time.      All of the patient's questions and concerns have been addressed.    Roderic Palau   [facility] Specialty Pharmacist

## 2022-02-21 MED FILL — VEMLIDY 25 MG TABLET: ORAL | 30 days supply | Qty: 30 | Fill #1

## 2022-03-25 ENCOUNTER — Ambulatory Visit: Admit: 2022-03-25 | Discharge: 2022-03-25 | Payer: PRIVATE HEALTH INSURANCE

## 2022-03-25 NOTE — Unmapped (Signed)
[facility] Refill Coordination Note    Specialty Medication(s) to be Shipped:   Infectious Disease: Vemlidy    Other medication(s) to be shipped: No additional medications requested for fill at this time     Alison Clark, DOB: Jan 25, 1976  Phone: [phone] (home)       All above HIPAA information was verified with patient.     Was a Nurse, learning disability used for this call? No    Completed refill call assessment today to schedule patient's medication shipment from the [facility] [phone]).  All relevant notes have been reviewed.     Specialty medication(s) and dose(s) confirmed: Regimen is correct and unchanged.   Changes to medications: Alison Clark reports no changes at this time.  Changes to insurance: No  New side effects reported not previously addressed with a pharmacist or physician: None reported  Questions for the pharmacist: No    Confirmed patient received a Conservation officer, historic buildings and a Surveyor, mining with first shipment. The patient will receive a drug information handout for each medication shipped and additional FDA Medication Guides as required.       DISEASE/MEDICATION-SPECIFIC INFORMATION        N/A    SPECIALTY MEDICATION ADHERENCE     Medication Adherence    Patient reported X missed doses in the last month: 0  Specialty Medication: vemlidy 25mg   Patient is on additional specialty medications: No  Patient is on more than two specialty medications: No  Any gaps in refill history greater than 2 weeks in the last 3 months: no  Demonstrates understanding of importance of adherence: yes  Informant: patient                          Were doses missed due to medication being on hold? No    Vemlidy 25mg : Patient has 8 days of medication on hand     REFERRAL TO PHARMACIST     Referral to the pharmacist: Not needed      [facility]     Shipping address confirmed in Epic.     Delivery Scheduled: Yes, Expected medication delivery date: 10/27.     Medication will be delivered via Next Day Courier to the prescription address in Epic [facility].    Alison Clark   [facility] Specialty Technician

## 2022-03-26 DIAGNOSIS — R928 Other abnormal and inconclusive findings on diagnostic imaging of breast: Principal | ICD-10-CM

## 2022-03-27 MED FILL — VEMLIDY 25 MG TABLET: ORAL | 30 days supply | Qty: 30 | Fill #2

## 2022-04-08 ENCOUNTER — Ambulatory Visit
Admit: 2022-04-08 | Discharge: 2022-04-09 | Payer: PRIVATE HEALTH INSURANCE | Attending: Student in an Organized Health Care Education/Training Program | Primary: Student in an Organized Health Care Education/Training Program

## 2022-04-08 ENCOUNTER — Ambulatory Visit: Admit: 2022-04-08 | Discharge: 2022-04-09 | Payer: PRIVATE HEALTH INSURANCE

## 2022-04-08 LAB — TSH: THYROID STIMULATING HORMONE: 1.24 u[IU]/mL (ref 0.550–4.780)

## 2022-04-08 LAB — HEMOGLOBIN A1C
ESTIMATED AVERAGE GLUCOSE: 111 mg/dL
HEMOGLOBIN A1C: 5.5 % (ref 4.8–5.6)

## 2022-04-08 NOTE — Unmapped (Signed)
Assessment/Plan:   Alison Clark is a 46 y.o. G2P2002  Here today for annual well woman exam.    Well Woman Exam  -Pap done today.   -Mammo done this year and BIRADS 0-> has dx tomo scheduled  -Counseled on importance of healthy diet and regular exercise. Pt encouraged to start exercising and eating healthily.   -Depression and DV screenings negative  -Routine health maintenance labs (hemoglobin A1c) done today  - Colonoscopy starting age 79  - DEXA scan ordered by PCP due to HBV medication; showed osteopenia. HBV medication was switched.   - Ca and Vit D recommended   - declined flu shot   - Reviewed slight changes to menstrual cycle can be normal at perimenopause. Will obtain TSH today. Reviewed that if she begins having heavy bleeding or skipping cycles, recommend follow-up for additional evaluation.   - Elevated BP today in office; no symptoms. Following with PCP for possible hypertension and checking BP at home. Dietary information provided      Subjective     HPI    Alison Clark is a 46 y.o. [id number]  Here today for annual well woman exam.    She recently moved from [facility] and wanted to establish care.     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    Medical history notable for history of hepatitis B   She has elevated BP but reports her home BP is normal. She is watching this and will follow-up with PCP about starting medication.     Gyn:   SA with husband of 17 years. Feels safe     She reports normal pap smears. Believes last pap smear was a few years ago.  Had one ovary removed due to a fibroid.    She reports her cycles are usually regular, but over the last few months have been coming a 1-2 weeks late. She had her tubes tied for contraception.   She denies heavy bleeding and when they do come they are overall normal in length and flow.     No hot flashes or vaginal dryness.     Exercise: rare  Depression: none  DV: denies      Chief Complaint    Gynecologic Exam           Past Medical History:   Diagnosis Date Chronic hepatitis B (CMS-HCC)     Eczema     Fibroid     ovaries    Hypertension      Past Surgical History:   Procedure Laterality Date    CESAREAN SECTION      OVARY SURGERY      had oopherectomy (one) due to a fibroid    SKIN GRAFT      on left leg at 6 years    TUBAL LIGATION         OB History       Gravida   2    Para   2    Term   2    Preterm        AB        Living   2         SAB        IAB        Ectopic        Molar        Multiple        Live Births   2  Obstetric Comments   Reports no issues with pregnancy               PGYN history:  No abnormal paps; no STIs, No cysts           Social History     Socioeconomic History    Marital status: Married   Tobacco Use    Smoking status: Never    Smokeless tobacco: Never   Vaping Use    Vaping Use: Never used   Substance and Sexual Activity    Alcohol use: Not Currently    Drug use: Never    Sexual activity: Yes     Partners: Male   Other Topics Concern    Do you use sunscreen? No    Tanning bed use? No    Are you easily burned? No    Excessive sun exposure? No    Blistering sunburns? No     Social Determinants of Health     Food Insecurity: No Food Insecurity (02/18/2022)    Hunger Vital Sign     Worried About Running Out of Food in the Last Year: Never true     Ran Out of Food in the Last Year: Never true     Family History   Problem Relation Age of Onset    Hepatitis Mother     Melanoma Neg Hx     Basal cell carcinoma Neg Hx     Squamous cell carcinoma Neg Hx     Breast cancer Neg Hx     Ovarian cancer Neg Hx     Colon cancer Neg Hx     Endometrial cancer Neg Hx          Meds- see Epic  All- see Epic    A 10-point review of systems was negative except as per HPI.       Objective   BP 161/81  - Pulse 63  - LMP 03/02/2022 (Exact Date)     Gen: well-appearing, well-developed, well-nourished female in NAD  Breast: deferred given upcoming mammogram  Abd: soft, obese, nontender, nondistended  Pelvic: nefg,normal well-rugated vaginal mucosa, cervix multiparous. No discharge. No bleeding. Uterus about 8 weeks, midplane, nontender. No adnexal tenderness/masses.   Ext: no calf tenderness, no edema, normal ROM

## 2022-04-11 ENCOUNTER — Ambulatory Visit: Admit: 2022-04-11 | Discharge: 2022-04-12 | Payer: PRIVATE HEALTH INSURANCE

## 2022-04-11 DIAGNOSIS — R928 Other abnormal and inconclusive findings on diagnostic imaging of breast: Principal | ICD-10-CM

## 2022-04-19 NOTE — Unmapped (Signed)
Called patient and reviewed AGC pap, HPV neg. Reviewed recommendation for colpo/ECC/EMB. Messaged sent to schedule patient for appointment. All questions answered.    Micki Riley MD   [facility] Family [facility]

## 2022-04-23 ENCOUNTER — Ambulatory Visit
Admit: 2022-04-23 | Discharge: 2022-04-24 | Payer: PRIVATE HEALTH INSURANCE | Attending: Student in an Organized Health Care Education/Training Program | Primary: Student in an Organized Health Care Education/Training Program

## 2022-04-23 DIAGNOSIS — R87619 Unspecified abnormal cytological findings in specimens from cervix uteri: Principal | ICD-10-CM

## 2022-04-23 NOTE — Unmapped (Addendum)
Referring Provider:McCain, [place] Steven, DO  [address]  [address]  [place],  [place] [address]    ASSESSMENT:  46 y.o. female at risk for cervical dysplasia.    PLAN:  1) Cervical Dysplasia: negative colposcopy. EMB performed without difficulty. Will call patient with results and follow up plan.   2) Healthcare maintenance: Gardasil vaccine #1 given today    Dr. Neysa Hotter was in agreement and immediately available for the care of the patient.    CC: Cervical Dysplasia    HPI: Alison Clark who presents today for evaluation and management of abnormal cervical cytology.      Hx of Chronic Hep B on tenofovir    Post-coital bleeding: no  Abnormal uterine bleeding: no  Patient's last menstrual period was 03/02/2022 (exact date).    Vaginal discharge: no  Currently pregnant?:no    Dysplasia Hx:  1.  04/08/2022 Atypical endocervical cells, HPV neg    MEDICATIONS:  Antiretroviral medications:no  Immunosuppressant medications:yes  Current Outpatient Medications on File Prior to Visit   Medication Sig Dispense Refill    cholecalciferol, vitamin D3-125 mcg, 5,000 unit,, 125 mcg (5,000 unit) tablet Take 1 tablet (125 mcg total) by mouth daily. (Patient not taking: Reported on 04/08/2022)      tenofovir alafenamide (VEMLIDY) 25 mg Tab tablet Take 1 tablet (25 mg total) by mouth daily. Take with food. 90 tablet 3     No current facility-administered medications on file prior to visit.         No Known Allergies    Past Medical History:   Diagnosis Date    Chronic hepatitis B (CMS-HCC)     Eczema     Fibroid     ovaries    Hypertension      -Hx of HIV:yes  -Hx of HIV testing:yes  --If yes, last test:12/2021 (year)     Past Surgical History:   Procedure Laterality Date    CESAREAN SECTION      OVARY SURGERY      had oopherectomy (one) due to a fibroid    SKIN GRAFT      on left leg at 6 years    TUBAL LIGATION         OB History       Gravida   2    Para   2    Term   2    Preterm        AB        Living   2         SAB        IAB Ectopic        Molar        Multiple        Live Births   2          Obstetric Comments   Reports no issues with pregnancy               GYNECOLOGIC HISTORY:   Contraception:yes  -If yes, method: S/p BTL  Current condom use:no  Hx of STI: yes, HPV  Number of HPV vaccinations:0    Social History     Socioeconomic History    Marital status: Married   Tobacco Use    Smoking status: Never    Smokeless tobacco: Never   Vaping Use    Vaping Use: Never used   Substance and Sexual Activity    Alcohol use: Not Currently    Drug  use: Never    Sexual activity: Yes     Partners: Male   Other Topics Concern    Do you use sunscreen? No    Tanning bed use? No    Are you easily burned? No    Excessive sun exposure? No    Blistering sunburns? No     Social Determinants of Health     Food Insecurity: No Food Insecurity (02/18/2022)    Hunger Vital Sign     Worried About Running Out of Food in the Last Year: Never true     Ran Out of Food in the Last Year: Never true       Cancer-related family history is negative for Melanoma, Breast cancer, Ovarian cancer, and Colon cancer.      ROS: Otherwise negative across the balance of 10 systems except as noted per HPI.    PHYSICAL EXAM:  BP 174/84  - LMP 03/02/2022 (Exact Date) Comment: Hx BTL    -General: NAD.  The patient is awake and alert.  -Psych: Appropriate patient interaction and affect.  The patient is appropriately addressed and interactive  .-Abd: Soft, non-tender, non-distended.  Normoactive bowel sounds.  No guarding or rebound.  No hernia.  -Genitourinary:      -EGBUS are normal.      -SSE:       -Vagina is pink, well rugated vaginal mucosa, and without gross lesions: Yes.        -Cervix:           -Pap smear performed today: no          -Colposcopy performed with 4% acetic acid:yes               -Entire transformation zone visualized: yes               -Lesion(s) Identified: no               -ECC performed: yes   -Extremities: No clubbing or edema.  No rash.    ENDOMETRIAL BIOPSY PROCEDURE NOTE     A speculum was placed in the vagina. Betadine was used to clean the cervix. A single tooth tenaculum was placed on the anterior lip of the cervix. An endometrial pipelle was advanced and the uterus sounded to 8 cm. Adequate tissue was removed in 2 pass/es. The tenaculum was then removed. Hemostasis was observed at the tenaculum sites. The speculum was removed.    The patient tolerated the procedure well.     Dr. Neysa Hotter was available for the procedure.

## 2022-04-23 NOTE — Unmapped (Signed)
Addended by: Lamar Benes on: 04/23/2022 12:50 PM     Modules accepted: Level of Service

## 2022-04-23 NOTE — Unmapped (Signed)
HPV VACCINE SCHEDULE:    HPV Vaccine #1 given today 04/23/2022  HPV Vaccine #2 due between 05/23/2022- 06/23/2021  HPV Vaccine #3 due between 10/21/2021

## 2022-04-28 NOTE — Unmapped (Signed)
Attempted to contact patient to confirm 04/30/22 biopsy appt. Left voicemail with call back information.

## 2022-04-28 NOTE — Unmapped (Signed)
[facility] Pharmacy Refill Coordination Note    Specialty Medication(s) to be Shipped:   Infectious Disease: Vemlidy    Other medication(s) to be shipped: No additional medications requested for fill at this time     Kabella Scallion, DOB: 02-19-1976  Phone: [phone] (home)       All above HIPAA information was verified with patient.     Was a Nurse, learning disability used for this call? No    Completed refill call assessment today to schedule patient's medication shipment from the [facility] [phone]).  All relevant notes have been reviewed.     Specialty medication(s) and dose(s) confirmed: Regimen is correct and unchanged.   Changes to medications: Shontavia reports no changes at this time.  Changes to insurance: No  New side effects reported not previously addressed with a pharmacist or physician: None reported  Questions for the pharmacist: No    Confirmed patient received a Conservation officer, historic buildings and a Surveyor, mining with first shipment. The patient will receive a drug information handout for each medication shipped and additional FDA Medication Guides as required.       DISEASE/MEDICATION-SPECIFIC INFORMATION        N/A    SPECIALTY MEDICATION ADHERENCE     Medication Adherence    Patient reported X missed doses in the last month: 0  Specialty Medication: vemlidy 25mg   Patient is on additional specialty medications: No  Patient is on more than two specialty medications: No                                Were doses missed due to medication being on hold? No    Vemlidy 25mg : Patient has 3 days of medication on hand     REFERRAL TO PHARMACIST     Referral to the pharmacist: Not needed      [facility]     Shipping address confirmed in Epic.     Delivery Scheduled: Yes, Expected medication delivery date: 04/30/22.     Medication will be delivered via Same Day Courier to the prescription address in Epic WAM.    Ernestine Mcmurray   [facility] Specialty Technician

## 2022-04-28 NOTE — Unmapped (Signed)
Pt returned call. Confirmed date and time of biopsy appt. with an expected arrival 30 minutes prior. Pt. denies allergies to local anesthetics or adhesives.  Pt is not on anticoagulant therapy. Reviewed patient pre-biopsy instructions.  Pt verbalized understanding.

## 2022-04-30 ENCOUNTER — Ambulatory Visit: Admit: 2022-04-30 | Discharge: 2022-05-01 | Payer: PRIVATE HEALTH INSURANCE

## 2022-04-30 MED FILL — VEMLIDY 25 MG TABLET: ORAL | 30 days supply | Qty: 30 | Fill #3

## 2022-05-01 NOTE — Unmapped (Signed)
Post-procedure follow-up call. Denies issues with pain or bleeding. Dressing is dry and intact. Encouraged patient to call breast imaging clinic or PCP with questions or  concerns. Pt verbalized understanding.

## 2022-05-02 DIAGNOSIS — R928 Other abnormal and inconclusive findings on diagnostic imaging of breast: Principal | ICD-10-CM

## 2022-05-23 NOTE — Unmapped (Signed)
[facility] Specialty Pharmacy Refill Coordination Note    Specialty Medication(s) to be Shipped:   Infectious Disease: Vemlidy    Other medication(s) to be shipped: No additional medications requested for fill at this time     Barbarita Coontz, DOB: 04/21/1976  Phone: [phone] (home)       All above HIPAA information was verified with patient.     Was a Nurse, learning disability used for this call? No    Completed refill call assessment today to schedule patient's medication shipment from the [facility] [phone]).  All relevant notes have been reviewed.     Specialty medication(s) and dose(s) confirmed: Regimen is correct and unchanged.   Changes to medications: Yuktha reports no changes at this time.  Changes to insurance: No  New side effects reported not previously addressed with a pharmacist or physician: None reported  Questions for the pharmacist: No    Confirmed patient received a Conservation officer, historic buildings and a Surveyor, mining with first shipment. The patient will receive a drug information handout for each medication shipped and additional FDA Medication Guides as required.       DISEASE/MEDICATION-SPECIFIC INFORMATION        N/A    SPECIALTY MEDICATION ADHERENCE     Medication Adherence    Patient reported X missed doses in the last month: 0  Specialty Medication: Vemlidy  Patient is on additional specialty medications: No  Informant: patient                                Were doses missed due to medication being on hold? No    Vemlidy 25 mg: ~7 days of medicine on hand       REFERRAL TO PHARMACIST     Referral to the pharmacist: Not needed      [facility]     Shipping address confirmed in Epic.     Delivery Scheduled: Yes, Expected medication delivery date: 05/29/22.     Medication will be delivered via Next Day Courier to the prescription address in Epic WAM.    Arnold Long, PharmD   [facility] Specialty Pharmacist

## 2022-05-28 MED FILL — VEMLIDY 25 MG TABLET: ORAL | 30 days supply | Qty: 30 | Fill #4

## 2022-06-26 ENCOUNTER — Institutional Professional Consult (permissible substitution): Admit: 2022-06-26 | Discharge: 2022-06-27 | Payer: PRIVATE HEALTH INSURANCE

## 2022-06-26 DIAGNOSIS — Z23 Encounter for immunization: Principal | ICD-10-CM

## 2022-06-26 MED ADMIN — human papillomavirus 9-valent vaccine (PF) (GARDASIL) injection suspension 0.5 mL: 0.5 mL | INTRAMUSCULAR | @ 15:00:00 | Stop: 2022-06-26

## 2022-06-26 NOTE — Unmapped (Signed)
Pt arrived for second dose of HPV vaccine; inserted in L deltoid at 1015 by Tedd Sias, RN; pt tolerated well; counseled to receive 3rd and final dose 6 months after first administration; this puts the patient receiving it around when she turns 47, so I consulted with Dr. Binnie Kand in-person and he confirms that it is medically ok to get it after 47, but insurance may not cover it and 2 doses is also ok for patient to receive if not; relayed this info to pt and pt verbalized understanding; pt will schedule for just before she turns 47. Efrain Sella, RN

## 2022-06-27 NOTE — Unmapped (Signed)
[facility] Special[facility] Pharmacy Refill Coordination Note    Specialty Medication(s) to be Shipped:   Infectious Disease: Vemlidy    Other medication(s) to be shipped: No additional medications requested for fill at this time     Alison Clark, DOB: 1975-12-18  Phone: [phone] (home)       All above HIPAA information was verified with patient.       Was a Nurse, learning disability used for this call? No    Completed refill call assessment today to schedule patient's medication shipment from the [facility] [phone]).  All relevant notes have been reviewed.     Specialty medication(s) and dose(s) confirmed: Regimen is correct and unchanged.   Changes to medications: Alison Clark reports no changes at this time.  Changes to insurance: No  New side effects reported not previously addressed with a pharmacist or physician: None reported  Questions for the pharmacist: No    Confirmed patient received a Conservation officer, historic buildings and a Surveyor, mining with first shipment. The patient will receive a drug information handout for each medication shipped and additional FDA Medication Guides as required.       DISEASE/MEDICATION-SPECIFIC INFORMATION        N/A    SPECIALTY MEDICATION ADHERENCE     Medication Adherence    Patient reported X missed doses in the last month: 0  Specialty Medication: VEMLIDY 25 mg  Patient is on additional specialty medications: No                                Were doses missed due to medication being on hold? No    Vemlidy 25 mg: 10 days of medicine on hand       REFERRAL TO PHARMACIST     Referral to the pharmacist: Not needed      [facility]     Shipping address confirmed in Epic.     Delivery Scheduled: Yes, Expected medication delivery date: 07/03/22.     Medication will be delivered via Next Day Courier to the prescription address in Epic WAM.    Alison Clark   [facility] Specialty Technician

## 2022-07-02 MED FILL — VEMLIDY 25 MG TABLET: ORAL | 30 days supply | Qty: 30 | Fill #5

## 2022-07-30 NOTE — Unmapped (Signed)
[facility] Specialty Pharmacy Refill Coordination Note    Specialty Medication(s) to be Shipped:   Infectious Disease: Vemlidy    Other medication(s) to be shipped: No additional medications requested for fill at this time     Alison Clark, DOB: 1975/08/25  Phone: [phone] (home)       All above HIPAA information was verified with patient.     Was a Nurse, learning disability used for this call? No    Completed refill call assessment today to schedule patient's medication shipment from the [facility] [phone]).  All relevant notes have been reviewed.     Specialty medication(s) and dose(s) confirmed: Regimen is correct and unchanged.   Changes to medications: Alison Clark reports no changes at this time.  Changes to insurance: No  New side effects reported not previously addressed with a pharmacist or physician: None reported  Questions for the pharmacist: No    Confirmed patient received a Conservation officer, historic buildings and a Surveyor, mining with first shipment. The patient will receive a drug information handout for each medication shipped and additional FDA Medication Guides as required.       DISEASE/MEDICATION-SPECIFIC INFORMATION        N/A    SPECIALTY MEDICATION ADHERENCE     Medication Adherence    Patient reported X missed doses in the last month: 0  Specialty Medication: vemlidy 25mg               Were doses missed due to medication being on hold? No    vemlidy 25mg    : 7 days of medicine on hand       REFERRAL TO PHARMACIST     Referral to the pharmacist: Not needed      [facility]     Shipping address confirmed in Epic.     Patient was notified of new phone menu : Yes    Delivery Scheduled: Yes, Expected medication delivery date: 3/5.     Medication will be delivered via Next Day Courier to the prescription address in Epic WAM.    Westley Gambles   [facility] Specialty Technician

## 2022-08-03 MED FILL — VEMLIDY 25 MG TABLET: ORAL | 30 days supply | Qty: 30 | Fill #6

## 2022-08-12 ENCOUNTER — Ambulatory Visit: Admit: 2022-08-12 | Discharge: 2022-08-13 | Payer: PRIVATE HEALTH INSURANCE

## 2022-08-12 DIAGNOSIS — R030 Elevated blood-pressure reading, without diagnosis of hypertension: Principal | ICD-10-CM

## 2022-08-12 DIAGNOSIS — N951 Menopausal and female climacteric states: Principal | ICD-10-CM

## 2022-08-12 DIAGNOSIS — N912 Amenorrhea, unspecified: Principal | ICD-10-CM

## 2022-08-12 NOTE — Unmapped (Signed)
Please start taking your blood pressures at home with your own blood pressure cuff. Blood pressure cuffs are available online on [employer] and at most drug/retail stores. It is best to purchase a blood pressure cuff that is automatic and goes on the upper arm. It is usually best to avoid blood pressure cuffs that go on the wrist as they are not as accurate. I want you to take your blood pressure once a day at random times of the day(morning, afternoon, evening, etc). When you take your blood pressure I would like you to sit in a comfortable, low stress environment for about 5 minutes before taking your blood pressure. I want you to log the date and time when you took your BP, the top and bottom numbers of the blood pressure and also the heart rate. Please start taking a log of your blood pressures and bring them with you to your next office visit.       Example:    Date                 Time            Blood Pressure                 Heart Rate    November 30, 2020      8:30am          BP: 125 / 82                            72    December 01, 2020      6:30pm          BP: 123 / 79                            68       Please call the office if your systolic BP (top number) is consistently above 135.    Please call the office if your systolic BP (top number) is consistently below 100.    Please call the office if your heart rate is consistently above 105.    Please call the office if your hear rate is consistently below 50.

## 2022-08-12 NOTE — Unmapped (Signed)
[facility] Visit    Patient ID: Alison Clark is a 47 y.o. female who is here for:  Office Visit (Irregular period last year and now she hasn't had a period in 2 months, dry eyes and hot flashes this has been going on for about 3 months)      Assessment/Plan:       Diagnosis ICD-10-CM Associated Orders   1. Amenorrhea  N91.2 POCT Pregnancy Urine, Qualitative      2. Perimenopausal  N95.1       3. White coat syndrome without diagnosis of hypertension  R03.0           Patient was advised that her lack of period is likely sign of entering the perimenopausal period.  Patient was advised that lab work is not needed at this time as her age is typical to enter perimenopausal.  We will plan for pregnancy check today to investigate the amenorrhea.  Patient was given guidance on perimenopause and menopause definitions.  Patient was given guidance on expected symptoms and management.  Patient is established with GYN and has plans for further follow-up with them as well.    Patient was advised that her blood pressures are slightly high.  Patient confirms that blood pressures are mostly normal at home.  Likely whitecoat syndrome.  Patient was advised to continue to monitor blood pressures at home with goal blood pressures between 115 and 130 systolic.     There are no Patient Instructions on file for this visit.      Medication risks and benefits provided to patient.     -- Patient verbalized an understanding of today's assessment and recommendations, as well as the purpose of ongoing medications.    Return in about 7 months (around 03/14/2023) for Annual physical.            [name], DO  [facility] at [place], [place]      Subjective:     HPI:    Patient is here for concern of missed periods.  Patient reports that she previously was regular and then starting in January she missed her period.  Patient has not had a menstrual cycle since then.  Patient is also started to experience hot flashes, dry eyes, trouble sleeping.    Patient's blood pressures are slightly elevated here in the office.  Patient reports that she measures her blood pressures at home with an average of 130/70 at home.    REVIEW OF SYSTEMS:     Review of Systems   Constitutional:  Negative for chills and fever.   Respiratory:  Negative for shortness of breath.    Cardiovascular:  Negative for chest pain.        HISTORY:     History was reviewed and electronic chart updated:     Past Medical History:   Diagnosis Date    Chronic hepatitis B (CMS-HCC)     Eczema     Fibroid     ovaries    Hypertension        Outpatient Medications Prior to Visit   Medication Sig Dispense Refill    tenofovir alafenamide (VEMLIDY) 25 mg Tab tablet Take 1 tablet (25 mg total) by mouth daily. Take with food. 90 tablet 3    cholecalciferol, vitamin D3-125 mcg, 5,000 unit,, 125 mcg (5,000 unit) tablet Take 1 tablet (125 mcg total) by mouth daily. (Patient not taking: Reported on 04/08/2022)       No  facility-administered medications prior to visit.        No Known Allergies    Social History     Socioeconomic History    Marital status: Married   Tobacco Use    Smoking status: Never    Smokeless tobacco: Never   Vaping Use    Vaping status: Never Used   Substance and Sexual Activity    Alcohol use: Not Currently    Drug use: Never    Sexual activity: Yes     Partners: Male   Other Topics Concern    Do you use sunscreen? No    Tanning bed use? No    Are you easily burned? No    Excessive sun exposure? No    Blistering sunburns? No     Social Determinants of Health     Financial Resource Strain: Low Risk  (07/26/2018)    Received from [facility]    Overall Financial Resource Strain (CARDIA)     Difficulty of Paying Living Expenses: Not hard at all   Food Insecurity: No Food Insecurity (08/12/2022)    Hunger Vital Sign     Worried About Running Out of Food in the Last Year: Never true     Ran Out of Food in the Last Year: Never true   Transportation Needs: No Transportation Needs (07/26/2018)    Received from [facility] - Transportation     Lack of Transportation (Medical): No     Lack of Transportation (Non-Medical): No   Physical Activity: Inactive (07/26/2018)    Received from [facility]    Exercise Vital Sign     Days of Exercise per Week: 0 days     Minutes of Exercise per Session: 0 Afiya   Stress: No Stress Concern Present (07/26/2018)    Received from [facility] of Occupational Health - Occupational Stress Questionnaire     Feeling of Stress : Not at all   Social Connections: Moderately Integrated (07/26/2018)    Received from [facility]    Social Connection and Isolation Panel [NHANES]     Frequency of Communication with Friends and Family: More than three times a week     Frequency of Social Gatherings with Friends and Family: More than three times a week     Attends Religious Services: 1 to 4 times per year     Active Member of [employer] or Organizations: No     Attends Banker Meetings: Never     Marital Status: Married       Objective:     Visit Vitals  BP 145/83   Pulse 62   Temp 36.1 ??C (97 ??F) (Temporal)   Ht 151.6 cm (4' 11.69)   Wt 52.6 kg (116 lb)   SpO2 100%   BMI 22.89 kg/m??       Blood Pressure for the past 24 hrs:   BP   08/12/22 1037 145/83       There were no vitals filed for this visit.       PHYSICAL EXAM:    Physical Exam  Vitals and nursing note reviewed.   Constitutional:       General: She is not in acute distress.     Appearance: Normal appearance.   HENT:      Head: Normocephalic and atraumatic.   Pulmonary:      Effort: Pulmonary effort is normal. No respiratory distress.   Neurological:  General: No focal deficit present.      Mental Status: She is alert.   Psychiatric:         Mood and Affect: Mood normal.         Behavior: Behavior normal.              Note - This record has been created using [employer]. Chart creation errors have been sought, but may not always have been located. Such creation errors do not reflect on the standard of medical care.

## 2022-09-03 MED FILL — VEMLIDY 25 MG TABLET: ORAL | 30 days supply | Qty: 30 | Fill #7

## 2022-09-03 NOTE — Unmapped (Signed)
[facility] Special[facility] Pharmacy Refill Coordination Note    Specialty Medication(s) to be Shipped:   Infectious Disease: Vemlidy    Other medication(s) to be shipped: No additional medications requested for fill at this time     Alison Clark, DOB: February 03, 1976  Phone: [phone] (home)       All above HIPAA information was verified with patient.     Was a Nurse, learning disability used for this call? No    Completed refill call assessment today to schedule patient's medication shipment from the [facility] [phone]).  All relevant notes have been reviewed.     Specialty medication(s) and dose(s) confirmed: Regimen is correct and unchanged.   Changes to medications: [name] reports no changes at this time.  Changes to insurance: No  New side effects reported not previously addressed with a pharmacist or physician: None reported  Questions for the pharmacist: No    Confirmed patient received a Conservation officer, historic buildings and a Surveyor, mining with first shipment. The patient will receive a drug information handout for each medication shipped and additional FDA Medication Guides as required.       DISEASE/MEDICATION-SPECIFIC INFORMATION        N/A    SPECIALTY MEDICATION ADHERENCE     Medication Adherence    Patient reported X missed doses in the last month: 0  Specialty Medication: vemlidy 25mg   Patient is on additional specialty medications: No              Were doses missed due to medication being on hold? No    Vemlidy 25 mg: 4 days of medicine on hand        REFERRAL TO PHARMACIST     Referral to the pharmacist: Not needed      [facility]     Shipping address confirmed in Epic.     Patient was notified of new phone menu : No    Delivery Scheduled: Yes, Expected medication delivery date: 09/05/22.     Medication will be delivered via Next Day Courier to the prescription address in Epic WAM.    Willette Pa   [facility] Specialty Technician

## 2022-10-03 NOTE — Unmapped (Signed)
[facility] Refill Coordination Note    Specialty Medication(s) to be Shipped:   Infectious Disease: Vemlidy 25mg     Other medication(s) to be shipped: No additional medications requested for fill at this time     Alison Clark, DOB: 1975-06-10  Phone: [phone] (home)       All above HIPAA information was verified with patient.     Was a Nurse, learning disability used for this call? No    Completed refill call assessment today to schedule patient's medication shipment from the [facility] [phone]).  All relevant notes have been reviewed.     Specialty medication(s) and dose(s) confirmed: Regimen is correct and unchanged.   Changes to medications: Alison Clark reports no changes at this time.  Changes to insurance: No  New side effects reported not previously addressed with a pharmacist or physician: None reported  Questions for the pharmacist: No    Confirmed patient received a Conservation officer, historic buildings and a Surveyor, mining with first shipment. The patient will receive a drug information handout for each medication shipped and additional FDA Medication Guides as required.       DISEASE/MEDICATION-SPECIFIC INFORMATION        N/A    SPECIALTY MEDICATION ADHERENCE     Medication Adherence    Patient reported X missed doses in the last month: 0  Specialty Medication: VEMLIDY 25 mg Tab tablet (tenofovir alafenamide)  Patient is on additional specialty medications: No  Informant: patient              Were doses missed due to medication being on hold? No    Vemlidy 25 mg: 7 days of medicine on hand       REFERRAL TO PHARMACIST     Referral to the pharmacist: Not needed      [facility]     Shipping address confirmed in Epic.       Delivery Scheduled: Yes, Expected medication delivery date: 10/08/22.     Medication will be delivered via Next Day Courier to the prescription address in Epic WAM.    Alison Clark   [facility] Specialty Technician

## 2022-10-05 NOTE — Unmapped (Signed)
[facility]  10/06/2022    Reason for visit: Follow up for Chronic hepatitis B (CMS-HCC) [B18.1]    Assessment/Plan:    Ms. Dinallo is a 47 y.o. 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 Unclear why but she was  previously started on treatment. At this point would elect to continue treatment in this 47 yo woman with chronic HBV. She has no hx of adv fibrosis/cirrhosis, and fibroscan in Aug 2023 showed CAP 241 (s1) and kPa 2.4 (F0). Her Adefovir is not an ideal treatment, and she had evidence of Osteopenia on DEXA in 2023 (Adefovir can contribute to osteopenia/osteoporosis). As such, made decision to switch agent to tenofovir based regimen with TAF. She does not yet meet criteria for [facility] screening (AASLD guidelines report starting for asian woman at age 45 without cirrhosis).      -- Continue Vemlidy (switched in Aug/Sep 2023)    -- CMP, CBC   -- Will need on going monitoring labs for chronic HBV : check HBV DNA, HBV sAg, HBVsAb   -- DEXA in 2023 c/w osteopenia;  Vit D/Ca supplementation (last level VitD ~15).    -- HAV immune     Return in about 6 months (around 04/08/2023).    Subjective   History of Present Illness   Accompanied by: N/A (unaccompanied)    Pt doing overall well. She has been compliant with her Vemlidy, no issues obtaining or taking the medication. Denies abd pain, fever, jaundice or other acute symptoms.     Objective   Physical Exam   Vital Signs: BP 153/95 (BP Site: L Arm, BP Position: Sitting, BP Cuff Size: Medium)  - Pulse 70  - Temp 36.7 ??C (98.1 ??F) (Temporal)  - Ht 152.4 cm (5')  - Wt 53.3 kg (117 lb 9.6 oz)  - LMP 10/03/2022  - SpO2 98%  - BMI 22.97 kg/m??   Constitutional: She is in no apparent distress  Eyes: Anicteric sclerae  Cardiovascular: No peripheral edema  Gastrointestinal: Soft, nontender abdomen without hepatosplenomegaly   Neurologic: Awake, alert, and oriented to person, place, and time with normal speech      Lab Results   Component Value Date    Total Bilirubin 0.5 10/06/2022    AST 16 10/06/2022    ALT 13 10/06/2022    Alkaline Phosphatase 91 10/06/2022      01/20/22 11:24 02/18/22 11:01   Hep A IgM Nonreactive    Hep A IgG Reactive !    Hep B Surface Ag For final results see Hepatitis B Surface Antigen Neutralization !    Hep B S Ag Neutralization Hepatitis B Surface Antigen POSITIVE;confirmed by neutralization !    Hep B S Ab Nonreactive    Hep B Surf Ab Quant <8.00    HBV DNA Quant Detected !    HBV DNA <10 (H)    HBV DNA Log10 See Comment    Hepatitis C Ab  Nonreactive   Hepatitis D Ab, Total Negative

## 2022-10-06 ENCOUNTER — Ambulatory Visit: Admit: 2022-10-06 | Discharge: 2022-10-06 | Payer: PRIVATE HEALTH INSURANCE

## 2022-10-06 ENCOUNTER — Ambulatory Visit
Admit: 2022-10-06 | Discharge: 2022-10-06 | Payer: PRIVATE HEALTH INSURANCE | Attending: Student in an Organized Health Care Education/Training Program | Primary: Student in an Organized Health Care Education/Training Program

## 2022-10-06 DIAGNOSIS — B181 Chronic viral hepatitis B without delta-agent: Principal | ICD-10-CM

## 2022-10-06 LAB — HEPATIC FUNCTION PANEL
ALBUMIN: 4.3 g/dL (ref 3.4–5.0)
ALKALINE PHOSPHATASE: 91 U/L (ref 46–116)
ALT (SGPT): 13 U/L (ref 10–49)
AST (SGOT): 16 U/L (ref <=34)
BILIRUBIN DIRECT: 0.2 mg/dL (ref 0.00–0.30)
BILIRUBIN TOTAL: 0.5 mg/dL (ref 0.3–1.2)
PROTEIN TOTAL: 7.8 g/dL (ref 5.7–8.2)

## 2022-10-06 LAB — BASIC METABOLIC PANEL
ANION GAP: 8 mmol/L (ref 5–14)
BLOOD UREA NITROGEN: 10 mg/dL (ref 9–23)
BUN / CREAT RATIO: 20
CALCIUM: 9.3 mg/dL (ref 8.7–10.4)
CHLORIDE: 107 mmol/L (ref 98–107)
CO2: 28.3 mmol/L (ref 20.0–31.0)
CREATININE: 0.49 mg/dL — ABNORMAL LOW
EGFR CKD-EPI (2021) FEMALE: >90 mL/min/{1.73_m2} (ref >=60)
GLUCOSE RANDOM: 96 mg/dL (ref 70–99)
POTASSIUM: 3.6 mmol/L (ref 3.4–4.8)
SODIUM: 143 mmol/L (ref 135–145)

## 2022-10-06 LAB — CBC W/ AUTO DIFF
BASOPHILS ABSOLUTE COUNT: 0 10*9/L (ref 0.0–0.1)
BASOPHILS RELATIVE PERCENT: 0.7 %
EOSINOPHILS ABSOLUTE COUNT: 0 10*9/L (ref 0.0–0.5)
EOSINOPHILS RELATIVE PERCENT: 0.9 %
HEMATOCRIT: 40.5 % (ref 34.0–44.0)
HEMOGLOBIN: 13.4 g/dL (ref 11.3–14.9)
LYMPHOCYTES ABSOLUTE COUNT: 0.8 10*9/L — ABNORMAL LOW (ref 1.1–3.6)
LYMPHOCYTES RELATIVE PERCENT: 20.6 %
MEAN CORPUSCULAR HEMOGLOBIN CONC: 33.2 g/dL (ref 32.0–36.0)
MEAN CORPUSCULAR HEMOGLOBIN: 25.5 pg — ABNORMAL LOW (ref 25.9–32.4)
MEAN CORPUSCULAR VOLUME: 76.9 fL — ABNORMAL LOW (ref 77.6–95.7)
MEAN PLATELET VOLUME: 8 fL (ref 6.8–10.7)
MONOCYTES ABSOLUTE COUNT: 0.3 10*9/L (ref 0.3–0.8)
MONOCYTES RELATIVE PERCENT: 7.3 %
NEUTROPHILS ABSOLUTE COUNT: 2.6 10*9/L (ref 1.8–7.8)
NEUTROPHILS RELATIVE PERCENT: 70.5 %
PLATELET COUNT: 246 10*9/L (ref 150–450)
RED BLOOD CELL COUNT: 5.26 10*12/L — ABNORMAL HIGH (ref 3.95–5.13)
RED CELL DISTRIBUTION WIDTH: 14 % (ref 12.2–15.2)
WBC ADJUSTED: 3.7 10*9/L (ref 3.6–11.2)

## 2022-10-06 MED ORDER — TENOFOVIR ALAFENAMIDE 25 MG TABLET
ORAL_TABLET | Freq: Every day | ORAL | 3 refills | 90 days | Status: CP
Start: 2022-10-06 — End: 2023-01-04

## 2022-10-06 NOTE — Unmapped (Signed)
[facility]  Phone: [phone]    Today's Visit:  -- Continue to take your antiviral medication     -- We have collected some lab-work today, they will be released to your MyChart as soon as they result. You may see them before I do. Once I see your results, I will send you a MyChart message or call.     -- I will see you back in clinic in 6 months. Please do not hesitate to call me or send a MyChart message with any questions between now and then.       Important Contact Numbers:    GI Clinic Appointments:? [phone]  Please call the GI Clinic appointment line if you need to schedule, reschedule or cancel an appointment in clinic.? They can also answer any questions you may have about where your appointment is located and when you need to arrive.    GI Procedure Appointments:?[phone]  Please call the GI Procedures line if you need to schedule, reschedule or cancel ANY type of procedure (EGD, colonoscopy, motility testing, etc).? You can also call this number for prep instructions, etc.?    Radiology: [id number], option 3 or 4  If you are being scheduled for any type of radiology, you will need to call to receive your appointment time.? Please call this number for information.?    For emergencies after normal business hours or on weekends/holidays  Proceed to the nearest emergency room OR contact the [facility] Operator at [phone] who can page the Gastroenterology Fellow on call.    Financial Counselors:   Tracey Harries [phone]   Dorisann Frames [phone]     Pharmacy Assistance: [phone]

## 2022-10-07 ENCOUNTER — Ambulatory Visit: Admit: 2022-10-07 | Discharge: 2022-10-07 | Payer: PRIVATE HEALTH INSURANCE

## 2022-10-07 DIAGNOSIS — R928 Other abnormal and inconclusive findings on diagnostic imaging of breast: Principal | ICD-10-CM

## 2022-10-07 DIAGNOSIS — B181 Chronic viral hepatitis B without delta-agent: Principal | ICD-10-CM

## 2022-10-07 LAB — HEPATITIS B DNA, QUANTITATIVE, PCR
HBV DNA LOG10: 1.2 {Log_IU}/mL — ABNORMAL HIGH (ref <=0.00)
HBV DNA QUANT: DETECTED — AB
HBV DNA: 16 [IU]/mL — ABNORMAL HIGH

## 2022-10-07 MED ORDER — VEMLIDY 25 MG TABLET
ORAL_TABLET | Freq: Every day | ORAL | 3 refills | 90 days | Status: CP
Start: 2022-10-07 — End: 2023-01-05
  Filled 2022-10-07: qty 30, 30d supply, fill #0

## 2022-10-07 NOTE — Unmapped (Signed)
I was immediately available via phone/pager or present on site.  I reviewed and discussed the case with the resident, but did not see the patient.  I agree with the assessment and plan as documented in the resident's note. Jerzie Bieri S Ronalee Scheunemann, MD

## 2022-10-08 LAB — HEPATITIS B E ANTIBODY: HEPATITIS BE ANTIBODY: POSITIVE — AB

## 2022-10-08 LAB — VITAMIN D 25 HYDROXY: VITAMIN D, TOTAL (25OH): 14.4 ng/mL — ABNORMAL LOW (ref 20.0–80.0)

## 2022-10-08 LAB — HEPATITIS B E ANTIGEN: HEPATITIS BE ANTIGEN: NEGATIVE

## 2022-10-08 LAB — HEPATITIS B SURFACE ANTIBODY
HEPATITIS B SURFACE ANTIBODY QUANT: <8 m[IU]/mL (ref <8.00)
HEPATITIS B SURFACE ANTIBODY: NONREACTIVE

## 2022-10-08 LAB — HEP.B NEUTRALIZATION: HBSAG NEUT: POSITIVE — AB

## 2022-10-08 LAB — HEPATITIS B SURFACE ANTIGEN

## 2022-10-16 ENCOUNTER — Institutional Professional Consult (permissible substitution): Admit: 2022-10-16 | Discharge: 2022-10-17 | Payer: PRIVATE HEALTH INSURANCE

## 2022-10-16 DIAGNOSIS — Z23 Encounter for immunization: Principal | ICD-10-CM

## 2022-10-16 MED ORDER — CHOLECALCIFEROL (VITAMIN D3) 25 MCG (1,000 UNIT) TABLET
ORAL_TABLET | Freq: Every day | ORAL | 3 refills | 90 days | Status: CP
Start: 2022-10-16 — End: 2023-10-16

## 2022-10-16 MED ADMIN — human papillomavirus 9-valent vaccine (PF) (GARDASIL) injection suspension 0.5 mL: 0.5 mL | INTRAMUSCULAR | @ 17:00:00 | Stop: 2022-10-16

## 2022-10-16 NOTE — Unmapped (Signed)
Pt presented for 3rd and final dose of Gardasil; administered in L deltoid; pt tolerated well. Efrain Sella, RN

## 2022-10-16 NOTE — Unmapped (Signed)
Called pt reviewed labs. Continues to have evidence of chronic inactive HBV infection with normal liver enzymes. Discussed continuing on Tenofovir. Also has evidence of low Vit D. Will prescribe supplement (she did not take the prev supplement prescribed). Discussed she will need to follow up a visit in Liver clinic in 3-6 months to reassess viral/liver tests.

## 2022-11-05 NOTE — Unmapped (Signed)
[facility] Refill Coordination Note    Specialty Medication(s) to be Shipped:   Infectious Disease: Vemlidy    Other medication(s) to be shipped: No additional medications requested for fill at this time     Alison Clark, DOB: March 25, 1976  Phone: [phone] (home)       All above HIPAA information was verified with patient.     Was a Nurse, learning disability used for this call? No    Completed refill call assessment today to schedule patient's medication shipment from the [facility] [phone]).  All relevant notes have been reviewed.     Specialty medication(s) and dose(s) confirmed: Regimen is correct and unchanged.   Changes to medications: Alison Clark reports no changes at this time.  Changes to insurance: No  New side effects reported not previously addressed with a pharmacist or physician: None reported  Questions for the pharmacist: No    Confirmed patient received a Conservation officer, historic buildings and a Surveyor, mining with first shipment. The patient will receive a drug information handout for each medication shipped and additional FDA Medication Guides as required.       DISEASE/MEDICATION-SPECIFIC INFORMATION        N/A    SPECIALTY MEDICATION ADHERENCE     Medication Adherence    Patient reported X missed doses in the last month: 0  Specialty Medication: VEMLIDY 25 mg Tab tablet (tenofovir alafenamide)  Patient is on additional specialty medications: No  Patient is on more than two specialty medications: No  Any gaps in refill history greater than 2 weeks in the last 3 months: no  Demonstrates understanding of importance of adherence: yes  Informant: patient  Confirmed plan for next specialty medication refill: delivery by pharmacy  Refills needed for supportive medications: not needed          Refill Coordination    Has the Patients' Contact Information Changed: No  Is the Shipping Address Different: No         Were doses missed due to medication being on hold? No    VEMLIDY 25   mg: 5 days of medicine on hand       REFERRAL TO PHARMACIST     Referral to the pharmacist: Not needed      [facility]     Shipping address confirmed in Epic.       Delivery Scheduled: Yes, Expected medication delivery date: 11/07/2022.     Medication will be delivered via Next Day Courier to the prescription address in Epic WAM.    Alison Clark   [facility] Specialty Technician

## 2022-11-06 MED FILL — VEMLIDY 25 MG TABLET: ORAL | 30 days supply | Qty: 30 | Fill #1

## 2022-12-02 NOTE — Unmapped (Signed)
[facility] Refill Coordination Note    Specialty Medication(s) to be Shipped:   Infectious Disease: Vemlidy    Other medication(s) to be shipped: No additional medications requested for fill at this time     Alison Clark, DOB: [birth year]  Phone: [phone] (home)       All above HIPAA information was verified with patient.     Was a Nurse, learning disability used for this call? No    Completed refill call assessment today to schedule patient's medication shipment from the [facility] [phone]).  All relevant notes have been reviewed.     Specialty medication(s) and dose(s) confirmed: Regimen is correct and unchanged.   Changes to medications: Alison Clark reports no changes at this time.  Changes to insurance: No  New side effects reported not previously addressed with a pharmacist or physician: None reported  Questions for the pharmacist: No    Confirmed patient received a Conservation officer, historic buildings and a Surveyor, mining with first shipment. The patient will receive a drug information handout for each medication shipped and additional FDA Medication Guides as required.       DISEASE/MEDICATION-SPECIFIC INFORMATION        N/A    SPECIALTY MEDICATION ADHERENCE     Medication Adherence    Patient reported X missed doses in the last month: 0  Specialty Medication: VEMLIDY 25 mg Tab tablet (tenofovir alafenamide)  Patient is on additional specialty medications: No  Patient is on more than two specialty medications: No  Any gaps in refill history greater than 2 weeks in the last 3 months: no  Demonstrates understanding of importance of adherence: yes  Informant: patient  Confirmed plan for next specialty medication refill: delivery by pharmacy  Refills needed for supportive medications: not needed          Refill Coordination    Has the Patients' Contact Information Changed: No  Is the Shipping Address Different: No         Were doses missed due to medication being on hold? No    VEMLIDY 25   mg: 7 days of medicine on hand       REFERRAL TO PHARMACIST     Referral to the pharmacist: Not needed      [facility]     Shipping address confirmed in Epic.       Delivery Scheduled: Yes, Expected medication delivery date: 12/05/2022.     Medication will be delivered via Same Day Courier to the prescription address in Epic WAM.    Alison Clark   [facility] Specialty Technician

## 2022-12-05 ENCOUNTER — Ambulatory Visit: Admit: 2022-12-05 | Discharge: 2022-12-06 | Payer: PRIVATE HEALTH INSURANCE

## 2022-12-05 DIAGNOSIS — R21 Rash and other nonspecific skin eruption: Principal | ICD-10-CM

## 2022-12-05 MED ORDER — PREDNISONE 10 MG TABLET
ORAL_TABLET | ORAL | 0 refills | 6 days | Status: CP
Start: 2022-12-05 — End: 2022-12-10

## 2022-12-05 MED FILL — VEMLIDY 25 MG TABLET: ORAL | 30 days supply | Qty: 30 | Fill #2

## 2022-12-05 NOTE — Unmapped (Signed)
Start Prednisone as prescribed. Take in morning    May take Benadryl at night for itching if needed

## 2022-12-05 NOTE — Unmapped (Signed)
Name:  Alison Clark  DOB: 1975-09-01  Date: 12/05/2022    ASSESSMENT/PLAN:  There are no diagnoses linked to this encounter.  Alison Clark is a 47 y.o. female with 5-day history of a pruritic rash that started on her right upper back and is since spread down her right arm and to her right leg.  On exam, the rash is papular and scattered.  Consistent with contact dermatitis.  Will treat with oral prednisone.  May continue using triamcinolone cream as needed twice a day  ------------------------------------------------------------------------------    Chief Complaint   Patient presents with    Rash     Started 5 days ago on her body. Itches and it is worse at night. Has used Claritin and a cream bit it has not helped very much.         HPI: Alison Clark is a 47 y.o. female with 5-day history of a pruritic rash that for started on her right upper back and is since spread down her right arm to her hand and now is on her right lower leg.  Patient has applied triamcinolone cream to take a Claritin with little relief.  Denies new lotions, soaps, detergents, medications, foods.    ROS:  Review of systems as above.  Rest of review of systems negative unless otherwise noted as per HPI.    I have reviewed past medical, surgical, medications, allergies, social and family histories today and updated them in Epic where appropriate.    PMH:  Past Medical History:   Diagnosis Date    Chronic hepatitis B (CMS-HCC)     Eczema     Fibroid     ovaries    Hypertension          MEDS:    Current Outpatient Medications:     cholecalciferol, vitamin D3-125 mcg, 5,000 unit,, 1,000 unit (25 mcg) tablet, Take 5 tablets (125 mcg total) by mouth daily., Disp: 450 tablet, Rfl: 3    tenofovir alafenamide (VEMLIDY) 25 mg Tab tablet, Take 1 tablet (25 mg total) by mouth daily. Take with food., Disp: 90 tablet, Rfl: 3    tenofovir alafenamide (VEMLIDY) 25 mg Tab tablet, Take 1 tablet (25 mg total) by mouth daily. Take with food., Disp: 90 tablet, Rfl: 3    ALL:  No Known Allergies    SH:  Social History     Tobacco Use    Smoking status: Never     Passive exposure: Never    Smokeless tobacco: Never   Vaping Use    Vaping status: Never Used   Substance Use Topics    Alcohol use: Not Currently    Drug use: Never         VITALS:  Vitals:    12/05/22 1156   BP: 135/82   Pulse: 76   Resp: 15   Temp: 36.5 ??C (97.7 ??F)   SpO2: 98%     Body mass index is 22.46 kg/m??.    Physical Exam  Constitutional:       Appearance: Normal appearance.   Skin:     Findings: Rash present. Rash is papular.      Comments: Erythematous papular rash scattered on right upper back, right upper arm, right forearm, right hand, right ankle   Neurological:      Mental Status: She is alert.         TEST  RESULTS:    No results found for this visit on 12/05/22.  SCREENINGS:      Follow-up as Needed  and Follow-up with PCP              Damien Fusi, PA  [facility] [place]  ----------------------------------------------------------------  Note - This record has been created using [employer]. Chart creation errors have been sought, but may not always have been located. Such creation errors do not reflect on the standard of medical care.

## 2023-01-08 MED FILL — VEMLIDY 25 MG TABLET: ORAL | 30 days supply | Qty: 30 | Fill #3

## 2023-01-08 NOTE — Unmapped (Signed)
[facility] Clinical Assessment & Refill Coordination Note    Alison Clark, Alison Clark: [birth year]  Phone: [phone] (home)     All above HIPAA information was verified with patient.     Was a Nurse, learning disability used for this call? No    Specialty Medication(s):   Infectious Disease: Vemlidy     Current Outpatient Medications   Medication Sig Dispense Refill    cholecalciferol, vitamin D3-125 mcg, 5,000 unit,, 1,000 unit (25 mcg) tablet Take 5 tablets (125 mcg total) by mouth daily. 450 tablet 3    tenofovir alafenamide (VEMLIDY) 25 mg Tab tablet Take 1 tablet (25 mg total) by mouth daily. Take with food. 90 tablet 3     No current facility-administered medications for this visit.        Changes to medications: Imari reports no changes at this time.    No Known Allergies    Changes to allergies: No    SPECIALTY MEDICATION ADHERENCE     Vemlidy 25 mg: 4 to 5 days of medicine on hand     Medication Adherence    Patient reported X missed doses in the last month: 0  Specialty Medication: Vemlidy 25mg   Patient is on additional specialty medications: No  Any gaps in refill history greater than 2 weeks in the last 3 months: no  Demonstrates understanding of importance of adherence: yes  Informant: patient  Provider-estimated medication adherence level: good  Patient is at risk for Non-Adherence: No          Specialty medication(s) dose(s) confirmed: Regimen is correct and unchanged.     Are there any concerns with adherence? No    Adherence counseling provided? Not needed    CLINICAL MANAGEMENT AND INTERVENTION      Clinical Benefit Assessment:    Do you feel the medicine is effective or helping your condition? Yes      HEPATITIS B AND ASSOCIATED LABS:       Lab Results   Component Value Date/Time    HBVDNAQT Detected (A) 10/06/2022 10:16 AM    HBVDNAQT Detected (A) 01/20/2022 11:24 AM    HBVDNACP 16 (H) 10/06/2022 10:16 AM    HBVDNACP <10 (H) 01/20/2022 11:24 AM    HBVD10 1.20 (H) 10/06/2022 10:16 AM HBVD10  01/20/2022 11:24 AM      Comment:      <1.00 log    HBEAG Negative 10/06/2022 10:16 AM    HBSAG (A) 10/06/2022 10:16 AM     For final results see Hepatitis B Surface Antigen Neutralization    HBSAG (A) 01/20/2022 11:24 AM     For final results see Hepatitis B Surface Antigen Neutralization    HEPBSAB Nonreactive 10/06/2022 10:16 AM    HEPBSAB Nonreactive 01/20/2022 11:24 AM    ALT 13 10/06/2022 10:16 AM    ALT 18 02/18/2022 11:01 AM    ALT 11 01/20/2022 11:23 AM    AST 16 10/06/2022 10:16 AM    CREATININE 0.49 (L) 10/06/2022 10:16 AM    CREATININE 0.71 02/18/2022 11:01 AM    CREATININE 0.58 (L) 01/20/2022 11:23 AM       Clinical Benefit counseling provided? Not needed    Adverse Effects Assessment:    Are you experiencing any side effects? No    Are you experiencing difficulty administering your medicine? No    Quality of Life Assessment:    How many days over the past month did your Hepatitis B  keep you from your normal  activities? For example, brushing your teeth or getting up in the morning. 0    Have you discussed this with your provider? Not needed    Acute Infection Status:    Acute infections noted within Epic:  No active infections  Patient reported infection: None    Therapy Appropriateness:    Is therapy appropriate based on current medication list, adverse reactions, adherence, clinical benefit and progress toward achieving therapeutic goals? Yes, therapy is appropriate and should be continued     DISEASE/MEDICATION-SPECIFIC INFORMATION      N/A    Hepatitis B: Not Applicable    PATIENT SPECIFIC NEEDS     Does the patient have any physical, cognitive, or cultural barriers? No    Is the patient high risk? No    Did the patient require a clinical intervention? No    Does the patient require physician intervention or other additional services (i.e., nutrition, smoking cessation, social work)? No    SOCIAL DETERMINANTS OF HEALTH     At the [facility], we have learned that life circumstances - like trouble affording food, housing, utilities, or transportation can affect the health of many of our patients.   That is why we wanted to ask: are you currently experiencing any life circumstances that are negatively impacting your health and/or quality of life? Patient declined to answer    Social Determinants of Health     Financial Resource Strain: Low Risk  (07/26/2018)    Received from [facility]    Overall Financial Resource Strain (CARDIA)     Difficulty of Paying Living Expenses: Not hard at all   Internet Connectivity: Not on file   Food Insecurity: No Food Insecurity (08/12/2022)    Hunger Vital Sign     Worried About Running Out of Food in the Last Year: Never true     Ran Out of Food in the Last Year: Never true   Tobacco Use: Low Risk  (12/05/2022)    Patient History     Smoking Tobacco Use: Never     Smokeless Tobacco Use: Never     Passive Exposure: Never   Housing/Utilities: Not on file   Alcohol Use: Not At Risk (10/21/2018)    Received from [facility]    AUDIT-C     Frequency of Alcohol Consumption: Never     Average Number of Drinks: Not on file     Frequency of Binge Drinking: Not on file   Transportation Needs: No Transportation Needs (07/26/2018)    Received from [facility]    [facility] - Transportation     Lack of Transportation (Medical): No     Lack of Transportation (Non-Medical): No   Substance Use: Not on file   Health Literacy: Low Risk  (02/18/2022)    Health Literacy     : Never   Physical Activity: Inactive (07/26/2018)    Received from [facility]    Exercise Vital Sign     Days of Exercise per Week: 0 days     Minutes of Exercise per Session: 0 Alison Clark   Interpersonal Safety: Unknown (01/07/2023)    Interpersonal Safety     Unsafe Where You Currently Live: Not on file     Physically Hurt by Anyone: Not on file     Abused by Anyone: Not on file   Stress: No Stress Concern Present (07/26/2018)    Received from [facility]    [employer] of Occupational Health - Occupational Stress Questionnaire     Feeling of Stress : Not at all   Intimate Partner Violence: Not At Risk (07/26/2018)    Received from [facility]    Humiliation, Afraid, Rape, and Kick questionnaire     Fear of Current or Ex-Partner: No     Emotionally Abused: No     Physically Abused: No     Sexually Abused: No   Depression: Not at risk (12/05/2022)    PHQ-2     PHQ-2 Score: 0   Social Connections: Moderately Integrated (07/26/2018)    Received from [facility], [facility]    Social Connection and Isolation Panel [NHANES]     Frequency of Communication with Friends and Family: More than three times a week     Frequency of Social Gatherings with Friends and Family: More than three times a week     Attends Religious Services: 1 to 4 times per year     Active Member of [employer] or Organizations: No     Attends Banker Meetings: Never     Marital Status: Married       Would you be willing to receive help with any of the needs that you have identified today? Not applicable       SHIPPING     Specialty Medication(s) to be Shipped:   Infectious Disease: Vemlidy    Other medication(s) to be shipped: No additional medications requested for fill at this time     Changes to insurance: No    Delivery Scheduled: Yes, Expected medication delivery date: 01/08/23.     Medication will be delivered via Same Day Courier to the confirmed prescription address in [facility].    The patient will receive a drug information handout for each medication shipped and additional FDA Medication Guides as required.  Verified that patient has previously received a Conservation officer, historic buildings and a Surveyor, mining.    The patient or caregiver noted above participated in the development of this care plan and knows that they can request review of or adjustments to the care plan at any time.      All of the patient's questions and concerns have been addressed.    Roderic Palau, PharmD   [facility] Specialty Pharmacist

## 2023-01-29 NOTE — Unmapped (Signed)
[facility] Refill Coordination Note    Specialty Medication(s) to be Shipped:   Infectious Disease: Vemlidy    Other medication(s) to be shipped: No additional medications requested for fill at this time     Alison Clark, DOB: 1976/03/31  Phone: [phone] (home)       All above HIPAA information was verified with patient.     Was a Nurse, learning disability used for this call? No    Completed refill call assessment today to schedule patient's medication shipment from the [facility] [phone]).  All relevant notes have been reviewed.     Specialty medication(s) and dose(s) confirmed: Regimen is correct and unchanged.   Changes to medications: Alison Clark reports no changes at this time.  Changes to insurance: No  New side effects reported not previously addressed with a pharmacist or physician: None reported  Questions for the pharmacist: No    Confirmed patient received a Conservation officer, historic buildings and a Surveyor, mining with first shipment. The patient will receive a drug information handout for each medication shipped and additional FDA Medication Guides as required.       DISEASE/MEDICATION-SPECIFIC INFORMATION        N/A    SPECIALTY MEDICATION ADHERENCE     Medication Adherence    Patient reported X missed doses in the last month: 0  Specialty Medication: VEMLIDY 25 mg Tab tablet (tenofovir alafenamide)  Patient is on additional specialty medications: No              Were doses missed due to medication being on hold? No    Vemlidy 25 mg: 7 days of medicine on hand       REFERRAL TO PHARMACIST     Referral to the pharmacist: Not needed      [facility]     Shipping address confirmed in Epic.       Delivery Scheduled: Yes, Expected medication delivery date: 02/03/23.     Medication will be delivered via Same Day Courier to the prescription address in Epic WAM.    Alison Clark   [facility] Specialty Technician

## 2023-02-02 MED FILL — VEMLIDY 25 MG TABLET: ORAL | 30 days supply | Qty: 30 | Fill #4

## 2023-02-06 ENCOUNTER — Ambulatory Visit
Admit: 2023-02-06 | Discharge: 2023-02-07 | Payer: PRIVATE HEALTH INSURANCE | Attending: Student in an Organized Health Care Education/Training Program | Primary: Student in an Organized Health Care Education/Training Program

## 2023-02-06 DIAGNOSIS — L309 Dermatitis, unspecified: Principal | ICD-10-CM

## 2023-02-06 MED ORDER — PREDNISONE 20 MG TABLET
ORAL_TABLET | Freq: Every day | ORAL | 0 refills | 4 days | Status: CP
Start: 2023-02-06 — End: 2023-02-10

## 2023-02-06 NOTE — Unmapped (Signed)
ASSESSMENT/PLAN:     Pt with dermatitis. Prednisone prescribed. Pt advised on signs and symptoms that would warrant returning to Urgent Care or Emergency Department. Pt voices understanding. Please return to Urgent Care or Emergency Department if symptoms persist or worsen.      Diagnosis ICD-10-CM Associated Orders   1. Dermatitis  L30.9         Requested Prescriptions     Signed Prescriptions Disp Refills    predniSONE (DELTASONE) 20 MG tablet 8 tablet 0     Sig: Take 2 tablets (40 mg total) by mouth daily for 4 days.     Instructions about new medications and side effects provided.  If any changes in chronic medications then new reconciled medication list is given to patient.     CHIEF COMPLAINT:     Chief Complaint   Patient presents with    Rash     Hives on legs 1 wk / new medicine maybe the cause ? / was seen on 7/5 same issue different leg        SUBJECTIVE/HPI:   47 y.o. Female presents with itchy rash on left upper leg x1 week. Pt has taken OTC medication for relief. Pt denies pain.    Past Medical History:   Diagnosis Date    Chronic hepatitis B (CMS-HCC)     Eczema     Fibroid     ovaries    Hypertension      No Known Allergies  Social History     Socioeconomic History    Marital status: Married     Spouse name: None    Number of children: None    Years of education: None    Highest education level: None   Tobacco Use    Smoking status: Never     Passive exposure: Never    Smokeless tobacco: Never   Vaping Use    Vaping status: Never Used   Substance and Sexual Activity    Alcohol use: Not Currently    Drug use: Never    Sexual activity: Yes     Partners: Male   Other Topics Concern    Do you use sunscreen? No    Tanning bed use? No    Are you easily burned? No    Excessive sun exposure? No    Blistering sunburns? No     Social Determinants of Health     Financial Resource Strain: Low Risk  (07/26/2018)    Received from [facility], [facility]    Overall Financial Resource Strain (CARDIA)     Difficulty of Paying Living Expenses: Not hard at all   Food Insecurity: No Food Insecurity (08/12/2022)    Hunger Vital Sign     Worried About Running Out of Food in the Last Year: Never true     Ran Out of Food in the Last Year: Never true   Transportation Needs: No Transportation Needs (07/26/2018)    Received from [facility], [facility]    [facility] - Transportation     Lack of Transportation (Medical): No     Lack of Transportation (Non-Medical): No   Physical Activity: Inactive (07/26/2018)    Received from [facility], [facility]    Exercise Vital Sign     Days of Exercise per Week: 0 days     Minutes of Exercise per Session: 0 Alison Clark   Stress: No Stress Concern Present (07/26/2018)    Received from [facility], [facility]  [employer] of Occupational Health - Occupational Stress Questionnaire     Feeling of Stress : Not at all   Social Connections: Moderately Integrated (07/26/2018)    Received from [facility], [facility]    Social Connection and Isolation Panel [NHANES]     Frequency of Communication with Friends and Family: More than three times a week     Frequency of Social Gatherings with Friends and Family: More than three times a week     Attends Religious Services: 1 to 4 times per year     Active Member of [employer] or Organizations: No     Attends Engineer, structural: Never     Marital Status: Married     Family History   Problem Relation Age of Onset    Hepatitis Mother     Melanoma Neg Hx     Basal cell carcinoma Neg Hx     Squamous cell carcinoma Neg Hx     Breast cancer Neg Hx     Ovarian cancer Neg Hx     Colon cancer Neg Hx     Endometrial cancer Neg Hx        ROS:   Review of Systems   Constitutional:  Negative for fever.   HENT:  Negative for congestion, ear pain and sore throat.    Eyes:  Negative for redness.   Respiratory:  Negative for cough and shortness of breath.    Cardiovascular:  Negative for chest pain.   Gastrointestinal:  Negative for abdominal pain, diarrhea, nausea and vomiting.   Genitourinary:  Negative for dysuria, frequency and urgency.   Musculoskeletal:  Negative for myalgias.   Skin:  Positive for rash.     See Subjective/HPI  Medications, Allergies and Problem List personally reviewed in Epic today    OBJECTIVE:          02/06/23 1113 02/06/23 1114   BP: 149/93 143/87   Pulse: 70    Resp: 19    Temp: 36.8 ??C (98.3 ??F)    PainSc: 0-No pain      Physical Exam  Vitals reviewed.   Constitutional:       General: She is not in acute distress.     Appearance: Normal appearance.   HENT:      Head: Normocephalic.      Right Ear: External ear normal.      Left Ear: External ear normal.      Nose: Nose normal.      Mouth/Throat:      Pharynx: Oropharynx is clear.   Eyes:      Conjunctiva/sclera: Conjunctivae normal.   Cardiovascular:      Rate and Rhythm: Normal rate.   Pulmonary:      Effort: Pulmonary effort is normal. No respiratory distress.   Skin:     General: Skin is warm and dry.      Comments: Nontender scaly rash on the left upper thigh. No signs of infection.   Neurological:      Mental Status: She is alert.          No results found.     LABS/X-RAYS/EKG/MEDS:       No results found for this visit on 02/06/23.    No results found.       PHQ-2 Score: 0    PHQ-9 Score:      Screening complete, no depression identified / no further action needed today      Follow-up with PCP  A copy of these instructions have been given to the patient or responsible adult who demonstrated the ability to learn, asked appropriate questions, and verbalized understanding of the plan of care.  There were no barriers to learning identified.    Please take the time to sign up for a MyChart Account. See sign up information in your After Visit Summary. You will be able to view lab results, make appointments, communicate with providers, and much more.

## 2023-03-05 NOTE — Unmapped (Signed)
The [facility] has made a second and final attempt to reach this patient to refill the following medication:VEMLIDY 25 mg Tab tablet (tenofovir alafenamide).      We have left voicemails on the following phone numbers: [phone] and [phone], have sent a text message to the following phone numbers: [phone], and have sent a Mychart questionnaire..    Dates contacted: 02/28/23 and 03/05/23  Last scheduled delivery: 02/02/23    The patient may be at risk of non-compliance with this medication. The patient should call the [facility] at [phone]  Option 4, then Option 4: Infectious Disease, Transplant to refill medication.    Kerby Less   [facility]

## 2023-03-06 NOTE — Unmapped (Signed)
[facility] Refill Coordination Note    Specialty Medication(s) to be Shipped:   General Specialty: Alison Clark    Other medication(s) to be shipped: No additional medications requested for fill at this time     Alison Clark, DOB: [birth year]  Phone: [phone] (home)       All above HIPAA information was verified with patient.     Was a Nurse, learning disability used for this call? No    Completed refill call assessment today to schedule patient's medication shipment from the [facility]  [phone]).  All relevant notes have been reviewed.     Specialty medication(s) and dose(s) confirmed: Regimen is correct and unchanged.   Changes to medications: Graceanne reports no changes at this time.  Changes to insurance: No  New side effects reported not previously addressed with a pharmacist or physician: None reported  Questions for the pharmacist: No    Confirmed patient received a Conservation officer, historic buildings and a Surveyor, mining with first shipment. The patient will receive a drug information handout for each medication shipped and additional FDA Medication Guides as required.       DISEASE/MEDICATION-SPECIFIC INFORMATION        N/A    SPECIALTY MEDICATION ADHERENCE     Medication Adherence    Patient reported X missed doses in the last month: 0  Specialty Medication: VEMLIDY 25 mg Tab tablet (tenofovir alafenamide)  Patient is on additional specialty medications: No              Were doses missed due to medication being on hold? No      VEMLIDY 25 mg Tab tablet (tenofovir alafenamide): 5 days of medicine on hand       REFERRAL TO PHARMACIST     Referral to the pharmacist: Not needed      [facility]     Shipping address confirmed in Epic.       Delivery Scheduled: Yes, Expected medication delivery date: 03/10/2023.     Medication will be delivered via Same Day Courier to the prescription address in Epic WAM.    Alison Clark [facility] Specialty Technician

## 2023-03-10 MED FILL — VEMLIDY 25 MG TABLET: ORAL | 30 days supply | Qty: 30 | Fill #5

## 2023-03-17 ENCOUNTER — Ambulatory Visit: Admit: 2023-03-17 | Discharge: 2023-03-18 | Payer: PRIVATE HEALTH INSURANCE

## 2023-03-17 DIAGNOSIS — Z1211 Encounter for screening for malignant neoplasm of colon: Principal | ICD-10-CM

## 2023-03-17 DIAGNOSIS — L309 Dermatitis, unspecified: Principal | ICD-10-CM

## 2023-03-17 DIAGNOSIS — E559 Vitamin D deficiency, unspecified: Principal | ICD-10-CM

## 2023-03-17 DIAGNOSIS — G5603 Carpal tunnel syndrome, bilateral upper limbs: Principal | ICD-10-CM

## 2023-03-17 DIAGNOSIS — R87619 Unspecified abnormal cytological findings in specimens from cervix uteri: Principal | ICD-10-CM

## 2023-03-17 DIAGNOSIS — Z1231 Encounter for screening mammogram for malignant neoplasm of breast: Principal | ICD-10-CM

## 2023-03-17 DIAGNOSIS — Z0000 Encounter for general adult medical examination without abnormal findings: Principal | ICD-10-CM

## 2023-03-17 DIAGNOSIS — Z124 Encounter for screening for malignant neoplasm of cervix: Principal | ICD-10-CM

## 2023-03-17 DIAGNOSIS — N959 Unspecified menopausal and perimenopausal disorder: Principal | ICD-10-CM

## 2023-03-17 DIAGNOSIS — Z2821 Immunization not carried out because of patient refusal: Principal | ICD-10-CM

## 2023-03-17 DIAGNOSIS — R232 Flushing: Principal | ICD-10-CM

## 2023-03-17 LAB — COMPREHENSIVE METABOLIC PANEL
ALBUMIN: 4.3 g/dL (ref 3.4–5.0)
ALKALINE PHOSPHATASE: 91 U/L (ref 46–116)
ALT (SGPT): 17 U/L (ref 10–49)
ANION GAP: 7 mmol/L (ref 5–14)
AST (SGOT): 18 U/L (ref <=34)
BILIRUBIN TOTAL: 0.8 mg/dL (ref 0.3–1.2)
BLOOD UREA NITROGEN: 14 mg/dL (ref 9–23)
BUN / CREAT RATIO: 24
CALCIUM: 9.7 mg/dL (ref 8.7–10.4)
CHLORIDE: 108 mmol/L — ABNORMAL HIGH (ref 98–107)
CO2: 29.6 mmol/L (ref 20.0–31.0)
CREATININE: 0.58 mg/dL
EGFR CKD-EPI (2021) FEMALE: >90 mL/min/{1.73_m2} (ref >=60)
GLUCOSE RANDOM: 95 mg/dL (ref 70–99)
POTASSIUM: 3.9 mmol/L (ref 3.4–4.8)
PROTEIN TOTAL: 7.3 g/dL (ref 5.7–8.2)
SODIUM: 145 mmol/L (ref 135–145)

## 2023-03-17 LAB — CBC W/ AUTO DIFF
BASOPHILS ABSOLUTE COUNT: 0 10*9/L (ref 0.0–0.1)
BASOPHILS RELATIVE PERCENT: 0.9 %
EOSINOPHILS ABSOLUTE COUNT: 0 10*9/L (ref 0.0–0.5)
EOSINOPHILS RELATIVE PERCENT: 1.1 %
HEMATOCRIT: 38.7 % (ref 34.0–44.0)
HEMOGLOBIN: 13.3 g/dL (ref 11.3–14.9)
LYMPHOCYTES ABSOLUTE COUNT: 1.1 10*9/L (ref 1.1–3.6)
LYMPHOCYTES RELATIVE PERCENT: 23.8 %
MEAN CORPUSCULAR HEMOGLOBIN CONC: 34.3 g/dL (ref 32.0–36.0)
MEAN CORPUSCULAR HEMOGLOBIN: 26.4 pg (ref 25.9–32.4)
MEAN CORPUSCULAR VOLUME: 76.9 fL — ABNORMAL LOW (ref 77.6–95.7)
MEAN PLATELET VOLUME: 8 fL (ref 6.8–10.7)
MONOCYTES ABSOLUTE COUNT: 0.3 10*9/L (ref 0.3–0.8)
MONOCYTES RELATIVE PERCENT: 5.9 %
NEUTROPHILS ABSOLUTE COUNT: 3.1 10*9/L (ref 1.8–7.8)
NEUTROPHILS RELATIVE PERCENT: 68.3 %
NUCLEATED RED BLOOD CELLS: 0 /100{WBCs} (ref <=4)
PLATELET COUNT: 250 10*9/L (ref 150–450)
RED BLOOD CELL COUNT: 5.03 10*12/L (ref 3.95–5.13)
RED CELL DISTRIBUTION WIDTH: 12.9 % (ref 12.2–15.2)
WBC ADJUSTED: 4.5 10*9/L (ref 3.6–11.2)

## 2023-03-17 LAB — THYROID FUNCTION CASCADE: THYROID STIMULATING HORMONE: 1.811 u[IU]/mL (ref 0.550–4.780)

## 2023-03-17 LAB — LIPID PANEL
CHOLESTEROL/HDL RATIO SCREEN: 3.1 (ref 1.0–4.5)
CHOLESTEROL: 199 mg/dL (ref <=200)
HDL CHOLESTEROL: 64 mg/dL — ABNORMAL HIGH (ref 40–60)
LDL CHOLESTEROL CALCULATED: 109 mg/dL — ABNORMAL HIGH (ref 40–99)
NON-HDL CHOLESTEROL: 135 mg/dL — ABNORMAL HIGH (ref 70–130)
TRIGLYCERIDES: 132 mg/dL (ref 0–150)
VLDL CHOLESTEROL CAL: 26.4 mg/dL (ref 9–37)

## 2023-03-17 MED ORDER — TRIAMCINOLONE ACETONIDE 0.1 % TOPICAL OINTMENT
Freq: Two times a day (BID) | TOPICAL | 2 refills | 0 days | Status: CP
Start: 2023-03-17 — End: ?

## 2023-03-17 NOTE — Unmapped (Addendum)
For allergies I recommend taking the following medications:    Daily antihistamine (non-drowsy):    -Allegra/Fexofenadine 180mg  tablet daily  or  -Zyrtec/Certirizine 10mg  tablet daily  or  -Xyzal/Levocetirizine 5mg  tablet daily    -The above medications only work if taken regularly everyday so try them for at least a week before judging their effectiveness.  ___________________________      Return to GYN for updated pap smear    [facility] at [facility] [place]  Address: [address] [place], [place] [address]  Hours:   Open ? Closes 5?PM  Phone: [phone]

## 2023-03-17 NOTE — Unmapped (Signed)
[facility] at [facility] Visit    Patient ID: Alison Clark is a 47 y.o. female who is here for:  Annual Exam (Discuss occasional itching all over )      Assessment/Plan:       Diagnosis ICD-10-CM Associated Orders   1. Annual physical exam  Z00.00 CBC w/ Differential     Comprehensive Metabolic Panel     Lipid Panel      2. Colon cancer screening  Z12.11 Colorectal Cancer DNA + FIT      3. Pap smear for cervical cancer screening  Z12.4       4. Abnormal cervical Papanicolaou smear, unspecified abnormal pap finding  R87.619       5. Breast cancer screening by mammogram  Z12.31       6. Chronic dermatitis  L30.9 triamcinolone (KENALOG) 0.1 % ointment      7. Premenopausal patient  N95.9       8. Hot flashes  R23.2 Thyroid Function Cascade      9. Bilateral carpal tunnel syndrome  G56.03       10. Vitamin D deficiency  E55.9 Vitamin D 25 Hydroxy (25OH D2 + D3)          Vital signs normal.  Physical exam is unremarkable.     Above lab work ordered for patient.    Patient is up-to-date on hepatitis C screening.  Patient is up-to-date on tetanus vaccination.  Patient was advised that she is due for colon cancer screening.  Patient was advised that she has a history of abnormal Pap smear and history of colposcopy.  Patient is due for yearly Pap smear.  Patient prefers to see GYN for this.  Patient was advised to follow-up soon.  Patient is scheduled for mammogram soon.  Patient declines flu shot today.    Encouraged healthy diet and regular exercise.    Patient has history of chronic recurrent topical dermatitis.  Patient was advised regular antihistamines and topical steroid medication.    Patient was advised that she is likely entering into for stages of menopause.  Patient was advised to expect hot flashes and changes in menstrual cycle.    Patient was advised that numbness and tingling of her hands is likely bilateral carpal tunnel syndrome.  Patient was advised cock-up splints at night.       There are no Patient Instructions on file for this visit.      Medication risks and benefits provided to patient.     -- Patient verbalized an understanding of today's assessment and recommendations, as well as the purpose of ongoing medications.    Return in about 1 year (around 03/16/2024) for Annual physical.            [place] Rubena Roseman, DO  [facility] at [place], [place]      Subjective:     HPI:    Patient is here for annual physical.  Patient reports that she is doing well with healthy diet and regular physical activity.    Patient has a few concerns today.  Patient has a history of chronic dermatitis with some mild red rashes intermittently bothering her.  Patient has had topical and oral steroids for this before.  Patient is not taking any over-the-counter medications.    Patient is starting to experience some perimenopausal symptoms with changes in menstrual cycle timing and occasional hot flashes.    Patient is noticing some numbness and tingling in her bilateral hands especially  during bedtime and in the morning.    Patient reports history of vitamin D deficiency and would like to have this rechecked.    REVIEW OF SYSTEMS:     Review of Systems   Constitutional:  Negative for chills and fever.   HENT:  Negative for congestion and sore throat.    Eyes:  Negative for pain.   Respiratory:  Negative for cough and shortness of breath.    Cardiovascular:  Negative for chest pain.   Gastrointestinal:  Negative for abdominal pain and blood in stool.   Genitourinary:  Negative for dysuria.   Musculoskeletal:  Negative for back pain.   Skin:  Negative for rash.   Neurological:  Negative for weakness, numbness and headaches.   Psychiatric/Behavioral:  Negative for dysphoric mood.         HISTORY:     History was reviewed and electronic chart updated:     Past Medical History:   Diagnosis Date    Chronic hepatitis B (CMS-HCC)     Eczema     Fibroid     ovaries    Hypertension        Outpatient Medications Prior to Visit   Medication Sig Dispense Refill    tenofovir alafenamide (VEMLIDY) 25 mg Tab tablet Take 1 tablet (25 mg total) by mouth daily. Take with food. 90 tablet 3    cholecalciferol, vitamin D3-125 mcg, 5,000 unit,, 1,000 unit (25 mcg) tablet Take 5 tablets (125 mcg total) by mouth daily. (Patient not taking: Reported on 03/17/2023) 450 tablet 3     No facility-administered medications prior to visit.        No Known Allergies    Social History     Socioeconomic History    Marital status: Married   Tobacco Use    Smoking status: Never     Passive exposure: Never    Smokeless tobacco: Never   Vaping Use    Vaping status: Never Used   Substance and Sexual Activity    Alcohol use: Not Currently    Drug use: Never    Sexual activity: Yes     Partners: Male   Other Topics Concern    Do you use sunscreen? No    Tanning bed use? No    Are you easily burned? No    Excessive sun exposure? No    Blistering sunburns? No     Social Determinants of Health     Financial Resource Strain: Low Risk  (07/26/2018)    Received from [facility], [facility]    Overall Financial Resource Strain (CARDIA)     Difficulty of Paying Living Expenses: Not hard at all   Food Insecurity: No Food Insecurity (08/12/2022)    Hunger Vital Sign     Worried About Running Out of Food in the Last Year: Never true     Ran Out of Food in the Last Year: Never true   Transportation Needs: No Transportation Needs (07/26/2018)    Received from [facility], [facility]    [facility] - Transportation     Lack of Transportation (Medical): No     Lack of Transportation (Non-Medical): No   Physical Activity: Inactive (07/26/2018)    Received from [facility]    Exercise Vital Sign     Days of Exercise per Week: 0 days     Minutes of Exercise per Session: 0 Alison Clark   Stress: No Stress Concern Present (07/26/2018)    [facility]    [employer] of Occupational Health - Occupational Stress Questionnaire     Feeling of Stress : Not at all   Social Connections: Moderately Integrated (07/26/2018)    Received from [facility], [facility]    Social Connection and Isolation Panel [NHANES]     Frequency of Communication with Friends and Family: More than three times a week     Frequency of Social Gatherings with Friends and Family: More than three times a week     Attends Religious Services: 1 to 4 times per year     Active Member of [employer] or Organizations: No     Attends Engineer, structural: Never     Marital Status: Married       Objective:     Visit Vitals  BP 158/94   Pulse 66   Ht 152.4 cm (5')   Wt 51.3 kg (113 lb)   LMP 11/22/2022 (Exact Date)   SpO2 99%   BMI 22.07 kg/m??       Blood Pressure for the past 24 hrs:   BP   03/17/23 0940 158/94       There were no vitals filed for this visit.       PHYSICAL EXAM:    Physical Exam  Vitals and nursing note reviewed.   Constitutional:       General: She is not in acute distress.     Appearance: Normal appearance.   HENT:      Head: Normocephalic and atraumatic.      Right Ear: Tympanic membrane, ear canal and external ear normal.      Left Ear: Tympanic membrane, ear canal and external ear normal.      Nose: No congestion.      Mouth/Throat:      Mouth: Mucous membranes are moist. No oral lesions.      Dentition: Normal dentition.      Pharynx: No oropharyngeal exudate or posterior oropharyngeal erythema.      Tonsils: No tonsillar exudate.   Eyes:      General: Lids are normal.         Right eye: No discharge.         Left eye: No discharge.      Conjunctiva/sclera: Conjunctivae normal.      Pupils: Pupils are equal, round, and reactive to light.   Neck:      Thyroid: No thyroid mass or thyromegaly.   Cardiovascular:      Rate and Rhythm: Normal rate and regular rhythm.      Heart sounds: Normal heart sounds.   Pulmonary:      Effort: Pulmonary effort is normal. No respiratory distress.      Breath sounds: Normal breath sounds.   Abdominal:      General: Bowel sounds are normal. There is no distension.      Palpations: Abdomen is soft.      Tenderness: There is no abdominal tenderness.   Musculoskeletal:         General: Normal range of motion.      Cervical back: Normal range of motion and neck supple.      Right lower leg: No edema.      Left lower leg: No edema.   Skin:     General: Skin is warm and dry.      Findings: No rash.   Neurological:      General: No focal deficit present.  Mental Status: She is alert and oriented to person, place, and time.   Psychiatric:         Mood and Affect: Mood normal.         Behavior: Behavior normal.              Note - This record has been created using [employer]. Chart creation errors have been sought, but may not always have been located. Such creation errors do not reflect on the standard of medical care.

## 2023-03-19 LAB — VITAMIN D 25 HYDROXY: VITAMIN D, TOTAL (25OH): 59.4 ng/mL (ref 20.0–80.0)

## 2023-03-26 NOTE — Unmapped (Signed)
This patient last saw Dr. Vita Barley in May 2024 and is due for a follow-up appointment next month. Will route this message to the Transplant clinic schedulers to make the pt an appointment with Dr. Joslyn Hy (new liver fellow).    Lanora Manis [employer]

## 2023-04-07 NOTE — Unmapped (Signed)
[facility] Delivery Pharm[facility] Refill Coordination Note    Specialty Medication(s) to be Shipped:   Infectious Disease: Vemlidy    Other medication(s) to be shipped: No additional medications requested for fill at this time     Alison Clark, DOB: 02-27-1976  Phone: [phone] (home)       All above HIPAA information was verified with patient.     Was a Nurse, learning disability used for this call? No    Completed refill call assessment today to schedule patient's medication shipment from the [facility]  [phone]).  All relevant notes have been reviewed.     Specialty medication(s) and dose(s) confirmed: Regimen is correct and unchanged.   Changes to medications: Alison Clark reports no changes at this time.  Changes to insurance: No  New side effects reported not previously addressed with a pharmacist or physician: None reported  Questions for the pharmacist: No    Confirmed patient received a Conservation officer, historic buildings and a Surveyor, mining with first shipment. The patient will receive a drug information handout for each medication shipped and additional FDA Medication Guides as required.       DISEASE/MEDICATION-SPECIFIC INFORMATION        N/A    SPECIALTY MEDICATION ADHERENCE     Medication Adherence    Patient reported X missed doses in the last month: 0  Specialty Medication: VEMLIDY 25 mg Tab tablet (tenofovir alafenamide)  Patient is on additional specialty medications: No  Patient is on more than two specialty medications: No  Any gaps in refill history greater than 2 weeks in the last 3 months: no  Demonstrates understanding of importance of adherence: yes  Informant: patient  Reliability of informant: reliable  Provider-estimated medication adherence level: good  Patient is at risk for Non-Adherence: No  Reasons for non-adherence: no problems identified              Were doses missed due to medication being on hold? No    VEMLIDY 25 mg Tab tablet (tenofovir alafenamide)  : 4 doses of medicine on hand       REFERRAL TO PHARMACIST     Referral to the pharmacist: Not needed      SHIPPING     Shipping address confirmed in Epic.       Delivery Scheduled: Yes, Expected medication delivery date: 04/09/23.     Medication will be delivered via Same Day Courier to the prescription address in Epic WAM.    [facility]  Specialty Technician

## 2023-04-09 ENCOUNTER — Ambulatory Visit: Admit: 2023-04-09 | Discharge: 2023-04-09 | Payer: BLUE CROSS/BLUE SHIELD

## 2023-04-09 LAB — COLOGUARD: COLOGUARD: NEGATIVE

## 2023-04-09 MED FILL — VEMLIDY 25 MG TABLET: ORAL | 30 days supply | Qty: 30 | Fill #6

## 2023-05-04 NOTE — Unmapped (Signed)
[facility] Specialty and Home Delivery Pharmacy Refill Coordination Note    Specialty Medication(s) to be Shipped:   Infectious Disease: Vemlidy    Other medication(s) to be shipped: No additional medications requested for fill at this time     Alison Clark, DOB: 09-07-1975  Phone: [phone] (home)       All above HIPAA information was verified with patient.     Was a Nurse, learning disability used for this call? No    Completed refill call assessment today to schedule patient's medication shipment from the [facility] and Home Delivery Pharmacy  [phone]).  All relevant notes have been reviewed.     Specialty medication(s) and dose(s) confirmed: Regimen is correct and unchanged.   Changes to medications: Min reports no changes at this time.  Changes to insurance: No  New side effects reported not previously addressed with a pharmacist or physician: None reported  Questions for the pharmacist: No    Confirmed patient received a Conservation officer, historic buildings and a Surveyor, mining with first shipment. The patient will receive a drug information handout for each medication shipped and additional FDA Medication Guides as required.       DISEASE/MEDICATION-SPECIFIC INFORMATION        N/A    SPECIALTY MEDICATION ADHERENCE     Medication Adherence    Patient reported X missed doses in the last month: 0  Specialty Medication: VEMLIDY 25 mg Tab tablet (tenofovir alafenamide)  Patient is on additional specialty medications: No  Patient is on more than two specialty medications: No  Any gaps in refill history greater than 2 weeks in the last 3 months: no  Demonstrates understanding of importance of adherence: yes              Were doses missed due to medication being on hold? No    VEMLIDY 25 mg Tab tablet (tenofovir alafenamide)  : 4-5  doses of medicine on hand       REFERRAL TO PHARMACIST     Referral to the pharmacist: Not needed      SHIPPING     Shipping address confirmed in Epic.       Delivery Scheduled: Yes, Expected medication delivery date: 05/07/23.     Medication will be delivered via Same Day Courier to the prescription address in Epic WAM.    Ricci Barker   [facility] Specialty and Home Delivery Pharmacy  Specialty Technician

## 2023-05-07 MED FILL — VEMLIDY 25 MG TABLET: ORAL | 30 days supply | Qty: 30 | Fill #7

## 2023-05-12 ENCOUNTER — Ambulatory Visit
Admit: 2023-05-12 | Discharge: 2023-05-13 | Payer: BLUE CROSS/BLUE SHIELD | Attending: Obstetrics & Gynecology | Primary: Obstetrics & Gynecology

## 2023-05-12 DIAGNOSIS — Z124 Encounter for screening for malignant neoplasm of cervix: Principal | ICD-10-CM

## 2023-05-12 NOTE — Unmapped (Signed)
ASSESSMENT/PLAN:  Alison Clark is a 47 y.o. G2P2002 was seen today for pap smear.    1. Reproductive life planning:  - given irregular menses, thinks she is perimenopausal  - participatory guidance regarding perimenopause, menopause, possible symptoms - denies bothersome symptoms (reports hot flashes at last PCP visit)      2. Preventive screening:  Cervical cancer:  - pap & HPV testing today; I will consult Dr. Mariann Barter when I see the results, given the atypical endocervical cells result in the past that falls outside of typical guidelines  - Rest of screening deferred to her PCP  -Breast cancer: MMG normal in November 2024  Colon cancer: FIT testing w/PCP  Skin cancer:encouraged avoiding peak sun exposure hours and using sun screen  Osteoporosis prevention: reviewed importance of adequate calcium and vitamin D as well as weight bearing activities for maintaining bone health and balance  Lab screening: deferred to PCP    3. Weight/Exercise:  - Patient counseled ZO:[id number] of a healthy diet and safe and sustainable weight loss goals. Also discussed the health benefits of exercise and recommendations for 30 minutes, 5 days/week. She is currently just running around with her kids, but will try to get more dedicated exercise.    Will follow-up for annual exam in 1 year or earlier as needed.  ______________________________________________    SUBJECTIVE:  CC:  abnormal pap     HPI: Alison Clark is a 47 y.o. female G2P2002 Patient's last menstrual period was 05/01/2023 (approximate)..   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     She has had an AE w/her PCP in October of this year and is up-to-date on all of her other preventive care screening.    PERTINENT GYN HISTORY:  > Contraception: condoms & timed intercourse   > Last pap & hx: per above  > Last Mammogram: 04/09/23 - plan for dx MMG in a year to monitor focal asymmetry in L breast        > Domestic Violence: no      OB HISTORY:   OB History   Gravida Para Term Preterm AB Living   2 2 2  0 0 2   SAB IAB Ectopic Molar Multiple Live Births   0 0 0 0 0 2   Obstetric Comments   Reports no issues with pregnancy     PAST MEDICAL HISTORY:   Past Medical History:   Diagnosis Date    Chronic hepatitis B (CMS-HCC)     Eczema     Fibroid     ovaries    Hypertension      PAST SURGICAL HISTORY:   Past Surgical History:   Procedure Laterality Date    BREAST BIOPSY Right     2023    CESAREAN SECTION      OVARY SURGERY      had oopherectomy (one) due to a fibroid    SKIN GRAFT      on left leg at 6 years    TUBAL LIGATION       MEDICATIONS:   Current Outpatient Medications   Medication Sig Dispense Refill    tenofovir alafenamide (VEMLIDY) 25 mg Tab tablet Take 1 tablet (25 mg total) by mouth daily. Take with food. 90 tablet 3    triamcinolone (KENALOG) 0.1 % ointment Apply topically two (2) times a day. 80 g 2     No current facility-administered medications for this visit.  ALLERGIES: Patient has no known allergies.    SOCIAL HISTORY:   Social History     Tobacco Use    Smoking status: Never     Passive exposure: Never    Smokeless tobacco: Never   Vaping Use    Vaping status: Never Used   Substance Use Topics    Alcohol use: Not Currently    Drug use: Never       REVIEW OF SYSTEMS:   Negative review of systems except as per HPI    OBJECTIVE:  BP 140/74 (BP Position: Sitting, BP Cuff Size: Medium)  - Pulse 65  - Wt 51.7 kg (114 lb)  - LMP 05/01/2023 (Approximate)  - BMI 22.26 kg/m??  Body mass index is 22.26 kg/m??.  GENERAL: alert, healthy, and thin  HEENT:NCAT  BREASTS:deferred  UROGENITAL  URETHRA:Normal  BLADDER:Normal  VULVA:normal hair distribution  VAGINA:normal ruggae  CERVIX: nuliparous appearing  UTERUS:normal size, contour, position, consistency, mobility, non-tender  ADNEXA:nontender, no masses  PERINEUM/ANUS:no lesions  R/V:deferred  EXTREMITIES:normal strength, tone, and muscle mass  SKIN:no lesions LABS:  Results for orders placed or performed in visit on 03/17/23   Colorectal Cancer DNA + FIT   Result Value Ref Range    Colorectal Cancer DNA + FIT Negative Negative   Comprehensive Metabolic Panel   Result Value Ref Range    Sodium 145 135 - 145 mmol/L    Potassium 3.9 3.4 - 4.8 mmol/L    Chloride 108 (H) 98 - 107 mmol/L    CO2 29.6 20.0 - 31.0 mmol/L    Anion Gap 7 5 - 14 mmol/L    BUN 14 9 - 23 mg/dL    Creatinine 3.66 4.40 - 1.02 mg/dL    BUN/Creatinine Ratio 24     eGFR CKD-EPI (2021) Female >90 >=60 mL/Alison/1.5m2    Glucose 95 70 - 99 mg/dL    Calcium 9.7 8.7 - 34.7 mg/dL    Albumin 4.3 3.4 - 5.0 g/dL    Total Protein 7.3 5.7 - 8.2 g/dL    Total Bilirubin 0.8 0.3 - 1.2 mg/dL    AST 18 <=42 U/L    ALT 17 10 - 49 U/L    Alkaline Phosphatase 91 46 - 116 U/L   Lipid Panel   Result Value Ref Range    Triglycerides 132 0 - 150 mg/dL    Cholesterol 595 <=638 mg/dL    HDL 64 (H) 40 - 60 mg/dL    LDL Calculated 756 (H) 40 - 99 mg/dL    VLDL Cholesterol Cal 26.4 9 - 37 mg/dL    Chol/HDL Ratio 3.1 1.0 - 4.5    Non-HDL Cholesterol 135 (H) 70 - 130 mg/dL    FASTING Yes    Vitamin D 25 Hydroxy (25OH D2 + D3)   Result Value Ref Range    Vitamin D Total (25OH) 59.4 20.0 - 80.0 ng/mL   Thyroid Function Cascade   Result Value Ref Range    TSH 1.811 0.550 - 4.780 uIU/mL   CBC w/ Differential   Result Value Ref Range    WBC 4.5 3.6 - 11.2 10*9/L    RBC 5.03 3.95 - 5.13 10*12/L    HGB 13.3 11.3 - 14.9 g/dL    HCT 43.3 29.5 - 18.8 %    MCV 76.9 (L) 77.6 - 95.7 fL    MCH 26.4 25.9 - 32.4 pg    MCHC 34.3 32.0 - 36.0 g/dL    RDW 41.6 60.6 - 30.1 %  MPV 8.0 6.8 - 10.7 fL    Platelet 250 150 - 450 10*9/L    nRBC 0 <=4 /100 WBCs    Neutrophils % 68.3 %    Lymphocytes % 23.8 %    Monocytes % 5.9 %    Eosinophils % 1.1 %    Basophils % 0.9 %    Absolute Neutrophils 3.1 1.8 - 7.8 10*9/L    Absolute Lymphocytes 1.1 1.1 - 3.6 10*9/L    Absolute Monocytes 0.3 0.3 - 0.8 10*9/L    Absolute Eosinophils 0.0 0.0 - 0.5 10*9/L    Absolute Basophils 0.0 0.0 - 0.1 10*9/L     Prior to the outpatient exam, consent was obtained from the patient prior to the sensitive portion of the exam. A medical student was involved, explicit additional consent was obtained    RN G Nonnemaker as chaperone    I spent more than 50% of this encounter counseling the patient face-to-face.

## 2023-06-08 NOTE — Unmapped (Signed)
[facility] Specialty and Home Delivery Pharmacy Refill Coordination Note    Specialty Medication(s) to be Shipped:   Infectious Disease: Vemlidy    Other medication(s) to be shipped: No additional medications requested for fill at this time     Alison Clark, DOB: [birth year]  Phone: [phone] (home)       All above HIPAA information was verified with patient.     Was a Nurse, learning disability used for this call? No    Completed refill call assessment today to schedule patient's medication shipment from the [facility] and Home Delivery Pharmacy  [phone]).  All relevant notes have been reviewed.     Specialty medication(s) and dose(s) confirmed: Regimen is correct and unchanged.   Changes to medications: [name] reports no changes at this time.  Changes to insurance: No  New side effects reported not previously addressed with a pharmacist or physician: None reported  Questions for the pharmacist: No    Confirmed patient received a Conservation officer, historic buildings and a Surveyor, mining with first shipment. The patient will receive a drug information handout for each medication shipped and additional FDA Medication Guides as required.       DISEASE/MEDICATION-SPECIFIC INFORMATION        N/A    SPECIALTY MEDICATION ADHERENCE     Medication Adherence    Patient reported X missed doses in the last month: 0  Specialty Medication: VEMLIDY 25 mg Tab tablet (tenofovir alafenamide)  Patient is on additional specialty medications: No  Informant: patient              Were doses missed due to medication being on hold? No      VEMLIDY 25 mg Tab tablet (tenofovir alafenamide): 2 days of medicine on hand       REFERRAL TO PHARMACIST     Referral to the pharmacist: Not needed      [facility]     Shipping address confirmed in Epic.       Delivery Scheduled: Yes, Expected medication delivery date: 06/10/23.     Medication will be delivered via Same Day Courier to the prescription address in Epic WAM.    Jerrett Baldinger   [facility] Specialty and Home Delivery Pharmacy  Specialty Technician

## 2023-06-10 MED FILL — VEMLIDY 25 MG TABLET: ORAL | 30 days supply | Qty: 30 | Fill #8

## 2023-06-12 NOTE — Unmapped (Signed)
Reviewed pap hx w.dysplasia expert:    Plan is for repeat pap in a year; if NL, repeat in 3 yrs; if NL, return to routine screening q 5 years. Patient aware.

## 2023-07-07 NOTE — Unmapped (Signed)
[facility] Specialty and Home Delivery Pharmacy Refill Coordination Note    Specialty Medication(s) to be Shipped:   Infectious Disease: Vemlidy    Other medication(s) to be shipped: No additional medications requested for fill at this time     Alison Clark, DOB: 1975-08-28  Phone: [phone] (home)       All above HIPAA information was verified with patient.     Was a Nurse, learning disability used for this call? No    Completed refill call assessment today to schedule patient's medication shipment from the [facility] and Home Delivery Pharmacy  [phone]).  All relevant notes have been reviewed.     Specialty medication(s) and dose(s) confirmed: Regimen is correct and unchanged.   Changes to medications: [name] reports no changes at this time.  Changes to insurance: No  New side effects reported not previously addressed with a pharmacist or physician: None reported  Questions for the pharmacist: No    Confirmed patient received a Conservation officer, historic buildings and a Surveyor, mining with first shipment. The patient will receive a drug information handout for each medication shipped and additional FDA Medication Guides as required.       DISEASE/MEDICATION-SPECIFIC INFORMATION        N/A    SPECIALTY MEDICATION ADHERENCE     Medication Adherence    Patient reported X missed doses in the last month: 0  Specialty Medication: VEMLIDY 25 mg Tab tablet (tenofovir alafenamide)  Patient is on additional specialty medications: No  Informant: patient              Were doses missed due to medication being on hold? No    Vemlidy 25 mg: 3 days of medicine on hand       REFERRAL TO PHARMACIST     Referral to the pharmacist: Not needed      [facility]     Shipping address confirmed in Epic.       Delivery Scheduled: Yes, Expected medication delivery date: 07/08/23.     Medication will be delivered via Same Day Courier to the prescription address in Epic WAM.    Jasper Loser   [facility] Specialty and Home Delivery Pharmacy  Specialty Technician

## 2023-07-08 MED FILL — VEMLIDY 25 MG TABLET: ORAL | 30 days supply | Qty: 30 | Fill #9

## 2023-07-10 DIAGNOSIS — B181 Chronic viral hepatitis B without delta-agent: Principal | ICD-10-CM

## 2023-07-10 MED ORDER — VEMLIDY 25 MG TABLET
ORAL_TABLET | Freq: Every day | ORAL | 1 refills | 90.00 days | Status: CP
Start: 2023-07-10 — End: 2024-01-06

## 2023-07-10 NOTE — Unmapped (Signed)
Patient called because her Vemlidy prescription is going to run out prior to her appointment in June.     Have sent in a new prescription to her pharmacy so she will not run out prior to her appointment.     Will also repeat viral hepatitis labs and CMP to ensure no side effects from the [facility] and that the treatment has remained effective.

## 2023-07-28 ENCOUNTER — Ambulatory Visit: Admit: 2023-07-28 | Discharge: 2023-07-29 | Payer: BLUE CROSS/BLUE SHIELD

## 2023-07-28 LAB — COMPREHENSIVE METABOLIC PANEL
ALBUMIN: 4.2 g/dL (ref 3.4–5.0)
ALKALINE PHOSPHATASE: 99 U/L (ref 46–116)
ALT (SGPT): 15 U/L (ref 10–49)
ANION GAP: 14 mmol/L (ref 5–14)
AST (SGOT): 18 U/L (ref <=34)
BILIRUBIN TOTAL: 0.8 mg/dL (ref 0.3–1.2)
BLOOD UREA NITROGEN: 15 mg/dL (ref 9–23)
BUN / CREAT RATIO: 23
CALCIUM: 9.9 mg/dL (ref 8.7–10.4)
CHLORIDE: 102 mmol/L (ref 98–107)
CO2: 28.3 mmol/L (ref 20.0–31.0)
CREATININE: 0.66 mg/dL (ref 0.55–1.02)
EGFR CKD-EPI (2021) FEMALE: >90 mL/min/{1.73_m2} (ref >=60)
GLUCOSE RANDOM: 93 mg/dL (ref 70–179)
POTASSIUM: 3.9 mmol/L (ref 3.4–4.8)
PROTEIN TOTAL: 7.6 g/dL (ref 5.7–8.2)
SODIUM: 144 mmol/L (ref 135–145)

## 2023-07-28 LAB — CBC
HEMATOCRIT: 40.9 % (ref 34.0–44.0)
HEMOGLOBIN: 13.4 g/dL (ref 11.3–14.9)
MEAN CORPUSCULAR HEMOGLOBIN CONC: 32.7 g/dL (ref 32.0–36.0)
MEAN CORPUSCULAR HEMOGLOBIN: 25.2 pg — ABNORMAL LOW (ref 25.9–32.4)
MEAN CORPUSCULAR VOLUME: 77.1 fL — ABNORMAL LOW (ref 77.6–95.7)
MEAN PLATELET VOLUME: 8 fL (ref 6.8–10.7)
PLATELET COUNT: 239 10*9/L (ref 150–450)
RED BLOOD CELL COUNT: 5.31 10*12/L — ABNORMAL HIGH (ref 3.95–5.13)
RED CELL DISTRIBUTION WIDTH: 13.4 % (ref 12.2–15.2)
WBC ADJUSTED: 4.4 10*9/L (ref 3.6–11.2)

## 2023-07-29 LAB — HEPATITIS B DNA, QUANTITATIVE, PCR: HBV DNA QUANT: NOT DETECTED

## 2023-07-30 LAB — HEPATITIS B E ANTIGEN: HEPATITIS BE ANTIGEN: NEGATIVE

## 2023-07-30 LAB — HEPATITIS B E ANTIBODY: HEPATITIS BE ANTIBODY: POSITIVE — AB

## 2023-07-31 DIAGNOSIS — B181 Chronic viral hepatitis B without delta-agent: Principal | ICD-10-CM

## 2023-07-31 MED ORDER — VEMLIDY 25 MG TABLET
ORAL_TABLET | Freq: Every day | ORAL | 3 refills | 90.00 days
Start: 2023-07-31 — End: 2023-10-29

## 2023-08-03 MED ORDER — VEMLIDY 25 MG TABLET
ORAL_TABLET | Freq: Every day | ORAL | 1 refills | 90.00 days | Status: CP
Start: 2023-08-03 — End: ?
  Filled 2023-08-07: qty 30, 30d supply, fill #0

## 2023-08-03 NOTE — Unmapped (Signed)
 Refill request for [facility] from the [facility] Specialty & Home Delivery Pharmacy     Last clinic visit was with Dr. Vita Barley on 10-06-22    Patient has an appointment with Dr. Joslyn Hy on 11-09-23    Last labwork was done on 07-28-23 and reviewed by Dr. Joslyn Hy      Chart reviewed--> Refill of Alison Clark was [facility] [place] on 07-10-23 by Dr. Joslyn Hy    I called the patient to make her aware--> pt stated that she isn't able to pick up the [facility] at [facility] and has always received it from the [facility] Specialty & Home Delivery pharmacy by mail    Will send the refill to the [facility] Specialty & Home Delivery Pharmacy    Will call [facility] and make them aware

## 2023-08-04 NOTE — Unmapped (Signed)
 [facility] Specialty and Home Delivery Pharmacy Refill Coordination Note    Specialty Medication(s) to be Shipped:   Infectious Disease: Vemlidy    Other medication(s) to be shipped: No additional medications requested for fill at this time     Alison Clark, DOB: January 27, 1976  Phone: [phone] (home)       All above HIPAA information was verified with patient.     Was a Nurse, learning disability used for this call? No    Completed refill call assessment today to schedule patient's medication shipment from the [facility] and Home Delivery Pharmacy  [phone]).  All relevant notes have been reviewed.     Specialty medication(s) and dose(s) confirmed: Regimen is correct and unchanged.   Changes to medications: [name] reports no changes at this time.  Changes to insurance: No  New side effects reported not previously addressed with a pharmacist or physician: None reported  Questions for the pharmacist: No    Confirmed patient received a Conservation officer, historic buildings and a Surveyor, mining with first shipment. The patient will receive a drug information handout for each medication shipped and additional FDA Medication Guides as required.       DISEASE/MEDICATION-SPECIFIC INFORMATION        N/A    SPECIALTY MEDICATION ADHERENCE     Medication Adherence    Patient reported X missed doses in the last month: 0  Specialty Medication: tenofovir alafenamide: VEMLIDY 25 mg Tab tablet  Patient is on additional specialty medications: No              Were doses missed due to medication being on hold? No    VEMLIDY 25 mg Tab tablet (tenofovir alafenamide): 3 days of medicine on hand       REFERRAL TO PHARMACIST     Referral to the pharmacist: Not needed      [facility]     Shipping address confirmed in Epic.       Delivery Scheduled: Yes, Expected medication delivery date: 08/07/23.     Medication will be delivered via Same Day Courier to the prescription address in Epic WAM.    Robbi Scurlock   [facility] Specialty and Home Delivery Pharmacy  Specialty Technician

## 2023-09-02 NOTE — Unmapped (Signed)
 [facility] Specialty and Home Delivery Pharmacy Refill Coordination Note    Specialty Medication(s) to be Shipped:   Infectious Disease: Vemlidy    Other medication(s) to be shipped: No additional medications requested for fill at this time     Alison Clark, DOB: [birth year]  Phone: [phone] (home)       All above HIPAA information was verified with patient.     Was a Nurse, learning disability used for this call? No    Completed refill call assessment today to schedule patient's medication shipment from the [facility] and Home Delivery Pharmacy  [phone]).  All relevant notes have been reviewed.     Specialty medication(s) and dose(s) confirmed: Regimen is correct and unchanged.   Changes to medications: [name] reports no changes at this time.  Changes to insurance: No  New side effects reported not previously addressed with a pharmacist or physician: None reported  Questions for the pharmacist: No    Confirmed patient received a Conservation officer, historic buildings and a Surveyor, mining with first shipment. The patient will receive a drug information handout for each medication shipped and additional FDA Medication Guides as required.       DISEASE/MEDICATION-SPECIFIC INFORMATION        N/A    SPECIALTY MEDICATION ADHERENCE     Medication Adherence    Patient reported X missed doses in the last month: 0  Specialty Medication: VEMLIDY 25 mg Tab tablet (tenofovir alafenamide)  Patient is on additional specialty medications: No              Were doses missed due to medication being on hold? No    VEMLIDY 25 mg Tab tablet (tenofovir alafenamide): 5 days of medicine on hand       REFERRAL TO PHARMACIST     Referral to the pharmacist: Not needed      [facility]     Shipping address confirmed in Epic.     Cost and Payment: Patient has a $0 copay, payment information is not required.    Delivery Scheduled: Yes, Expected medication delivery date: 09/04/23.     Medication will be delivered via Same Day Courier to the prescription address in Epic WAM.    Chaise Passarella   [facility] Specialty and Home Delivery Pharmacy  Specialty Technician

## 2023-09-04 MED FILL — VEMLIDY 25 MG TABLET: ORAL | 30 days supply | Qty: 30 | Fill #1

## 2023-09-21 NOTE — Unmapped (Signed)
 [facility] Specialty and Home Delivery Pharmacy Refill Coordination Note    Specialty Medication(s) to be Shipped:   Infectious Disease: Vemlidy     Other medication(s) to be shipped: No additional medications requested for fill at this time     Alison Clark, DOB: 08-11-1975  Phone: [phone] (home)       All above HIPAA information was verified with patient.     Was a Nurse, learning disability used for this call? No    Completed refill call assessment today to schedule patient's medication shipment from the [facility] and Home Delivery Pharmacy  [phone]).  All relevant notes have been reviewed.     Specialty medication(s) and dose(s) confirmed: Regimen is correct and unchanged.   Changes to medications: [name] reports no changes at this time.  Changes to insurance: No  New side effects reported not previously addressed with a pharmacist or physician: None reported  Questions for the pharmacist: No    Confirmed patient received a Conservation officer, historic buildings and a Surveyor, mining with first shipment. The patient will receive a drug information handout for each medication shipped and additional FDA Medication Guides as required.       DISEASE/MEDICATION-SPECIFIC INFORMATION        N/A    SPECIALTY MEDICATION ADHERENCE     Medication Adherence    Patient reported X missed doses in the last month: 0  Specialty Medication: VEMLIDY  25 mg Tab tablet (tenofovir  alafenamide)  Patient is on additional specialty medications: No              Were doses missed due to medication being on hold? No     VEMLIDY  25 mg Tab tablet (tenofovir  alafenamide): 14 days of medicine on hand       REFERRAL TO PHARMACIST     Referral to the pharmacist: Not needed      [facility]     Shipping address confirmed in Epic.     Cost and Payment: Patient has a $0 copay, payment information is not required.    Delivery Scheduled: Yes, Expected medication delivery date: 10/01/2023.     Medication will be delivered via Same Day Courier to the prescription address in Epic WAM.    [name]   [facility] Specialty and Home Delivery Pharmacy  Specialty Technician

## 2023-10-01 MED FILL — VEMLIDY 25 MG TABLET: ORAL | 30 days supply | Qty: 30 | Fill #2

## 2023-10-20 NOTE — Unmapped (Signed)
 [facility] Specialty and Home Delivery Pharmacy Refill Coordination Note    Specialty Medication(s) to be Shipped:   Infectious Disease: Vemlidy    Other medication(s) to be shipped: No additional medications requested for fill at this time     Alison Clark, DOB: 27-Jan-1976  Phone: [phone] (home)       All above HIPAA information was verified with patient.     Was a Nurse, learning disability used for this call? No    Completed refill call assessment today to schedule patient's medication shipment from the [facility] and Home Delivery Pharmacy  [phone]).  All relevant notes have been reviewed.     Specialty medication(s) and dose(s) confirmed: Regimen is correct and unchanged.   Changes to medications: Alison Clark reports no changes at this time.  Changes to insurance: No  New side effects reported not previously addressed with a pharmacist or physician: None reported  Questions for the pharmacist: No    Confirmed patient received a Conservation officer, historic buildings and a Surveyor, mining with first shipment. The patient will receive a drug information handout for each medication shipped and additional FDA Medication Guides as required.       DISEASE/MEDICATION-SPECIFIC INFORMATION        N/A    SPECIALTY MEDICATION ADHERENCE     Medication Adherence    Patient reported X missed doses in the last month: 0  Specialty Medication: VEMLIDY 25 mg Tab tablet (tenofovir alafenamide)  Patient is on additional specialty medications: No              Were doses missed due to medication being on hold? No     VEMLIDY 25 mg Tab tablet (tenofovir alafenamide): 10 days of medicine on hand       REFERRAL TO PHARMACIST     Referral to the pharmacist: Not needed      [facility]     Shipping address confirmed in Epic.     Cost and Payment: Patient has a $0 copay, payment information is not required.    Delivery Scheduled: Yes, Expected medication delivery date: 10/29/2023.     Medication will be delivered via Next Day Courier to the prescription address in Epic WAM.    Alison Clark   [facility] Specialty and Home Delivery Pharmacy  Specialty Technician

## 2023-10-28 NOTE — Unmapped (Signed)
 Alison Clark 's [place] shipment will be delayed as a result of the medication is too soon to refill until 10/31/23.     I have reached out to the patient  at [phone] and communicated the delay. We will wait for a call back from the patient to reschedule the delivery.  We have not confirmed the new delivery date.

## 2023-10-28 NOTE — Unmapped (Signed)
 10/28/2023 - Patient originally requested delivery for 10/28/2023. Delivery is not possible on this date due to rts. I have reached out to the patient and confirmed that delivery on Vemlidy  is ok. New date is 11/02/2023        Thank you,    Arlester Bence, CPhT  Specialty Pharmacy Technician  [facility] Specialty & Home Delivery Pharmacy  (P): [phone]

## 2023-11-02 MED FILL — VEMLIDY 25 MG TABLET: ORAL | 30 days supply | Qty: 30 | Fill #3

## 2023-11-09 ENCOUNTER — Ambulatory Visit: Admit: 2023-11-09 | Discharge: 2023-11-10 | Payer: BLUE CROSS/BLUE SHIELD

## 2023-11-09 LAB — COMPREHENSIVE METABOLIC PANEL
ALBUMIN: 4.4 g/dL (ref 3.4–5.0)
ALKALINE PHOSPHATASE: 89 U/L (ref 46–116)
ALT (SGPT): 18 U/L (ref 10–49)
ANION GAP: 9 mmol/L (ref 5–14)
AST (SGOT): 18 U/L (ref <=34)
BILIRUBIN TOTAL: 0.6 mg/dL (ref 0.3–1.2)
BLOOD UREA NITROGEN: 15 mg/dL (ref 9–23)
BUN / CREAT RATIO: 25
CALCIUM: 9.6 mg/dL (ref 8.7–10.4)
CHLORIDE: 107 mmol/L (ref 98–107)
CO2: 29 mmol/L (ref 20.0–31.0)
CREATININE: 0.61 mg/dL (ref 0.55–1.02)
EGFR CKD-EPI (2021) FEMALE: >90 mL/min/1.73m2 (ref >=60)
GLUCOSE RANDOM: 97 mg/dL (ref 70–179)
POTASSIUM: 3.9 mmol/L (ref 3.4–4.8)
PROTEIN TOTAL: 7.7 g/dL (ref 5.7–8.2)
SODIUM: 145 mmol/L (ref 135–145)

## 2023-11-09 LAB — CBC
HEMATOCRIT: 41.1 % (ref 34.0–44.0)
HEMOGLOBIN: 14.2 g/dL (ref 11.3–14.9)
MEAN CORPUSCULAR HEMOGLOBIN CONC: 34.5 g/dL (ref 32.0–36.0)
MEAN CORPUSCULAR HEMOGLOBIN: 25.8 pg — ABNORMAL LOW (ref 25.9–32.4)
MEAN CORPUSCULAR VOLUME: 74.8 fL — ABNORMAL LOW (ref 77.6–95.7)
MEAN PLATELET VOLUME: 7.9 fL (ref 6.8–10.7)
PLATELET COUNT: 243 10*9/L (ref 150–450)
RED BLOOD CELL COUNT: 5.5 10*12/L — ABNORMAL HIGH (ref 3.95–5.13)
RED CELL DISTRIBUTION WIDTH: 12.7 % (ref 12.2–15.2)
WBC ADJUSTED: 5.8 10*9/L (ref 3.6–11.2)

## 2023-11-09 NOTE — Unmapped (Signed)
 [facility]  [facility]  Terrance Ferretti, MD   [facility]  [address]  [place], [place] [address]  Main Clinic: [phone]  Appointment Schedulers: [phone]  Kurtis Philips, RN: [phone]  Fax: [phone]       Thank you for allowing me to participate in your medical care today. Here are my recommendations based on today's visit:    Please get labs checked every 3 months. You can have these done at [facility].   Please let me know if you have any questions.     Take care,  Terrance Ferretti, MD

## 2023-11-09 NOTE — Unmapped (Addendum)
 [facility]  11/09/2023    Reason for visit: Follow up for Chronic hepatitis B   [B18.1]    Assessment/Plan:      Alison Clark is a 48 y.o. 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 She is currently on Vemlidy, although unclear why but she was previously started on treatment. On establishing with [facility], we had elected to continue her treatment for hepatitis B. She has no hx of adv fibrosis/cirrhosis, and fibroscan in Aug 2023 showed CAP 241 (s1) and kPa 2.4 (F0). She had been on Adefovir, but was not an ideal treatment, and she had evidence of Osteopenia on DEXA in 2023 (Adefovir can contribute to osteopenia). As such, she was switched to tenofovir based regimen with TAF. She does not yet meet criteria for [facility] screening (AASLD guidelines report starting for asian woman at age 44 without cirrhosis).      - Continue Vemlidy (switched in Aug/Sep 2023)    - Will need on going monitoring labs for chronic HBV: check CBC, CMP, HBV DNA, HBV sAg, HBVsAb every 3 months  - No indication for HCC screening at this time, start at age 4  - HAV immune     Patient seen and plan formulated with attending, Dr. Monte Antonio    Terrance Ferretti, MD  Transplant Hepatology Fellow      Subjective   History of Present Illness   Accompanied by: N/A (unaccompanied)    48 y.o. female with a history of chronic hepatitis B (currently well controlled on Vemlidy) who presents for follow up.     She had already been on treatment with adefovir when she was initially seen in 12/2021. Her treatment regimen was changed to TAF at that visit due to risk for osteopenia seen on her DEXA scan.     She has been doing well since the last visit with no new medication changes or medical issues. She remains on Vemlidy and has been tolerating this well.     Objective   Physical Exam     Vital Signs: BP 128/84  - Pulse 74  - Temp 36.9 ??C (98.5 ??F) (Tympanic)  - Ht 152.4 cm (5')  - Wt 52.6 kg (115 lb 14.2 oz)  - SpO2 97%  - BMI 22.63 kg/m??   Constitutional: She is in no apparent distress  Eyes: Anicteric sclerae  Cardiovascular: No peripheral edema  Gastrointestinal: Soft, nontender abdomen without hepatosplenomegaly, hernias, or masses  Neurologic: Awake, alert, and oriented to person, place, and time with normal speech    Lab Results   Component Value Date    Total Bilirubin 0.6 11/09/2023    AST 18 11/09/2023    ALT 18 11/09/2023    Alkaline Phosphatase 89 11/09/2023

## 2023-11-10 LAB — HEPATITIS B DNA, QUANTITATIVE, PCR: HBV DNA QUANT: NOT DETECTED

## 2023-11-13 LAB — HEPATITIS B SURFACE ANTIBODY
HEPATITIS B SURFACE ANTIBODY QUANT: <8 m[IU]/mL (ref <8.00)
HEPATITIS B SURFACE ANTIBODY: NONREACTIVE

## 2023-11-13 LAB — HEPATITIS DELTA VIRUS ANTIBODY: HEPATITIS D ANTIBODY, TOTAL: NEGATIVE

## 2023-11-13 LAB — HEP.B NEUTRALIZATION: HBSAG NEUT: POSITIVE — AB

## 2023-11-13 LAB — HEPATITIS B SURFACE ANTIGEN

## 2023-11-16 NOTE — Unmapped (Signed)
 [facility] Specialty and Home Delivery Pharmacy Refill Coordination Note    Specialty Medication(s) to be Shipped:   Infectious Disease: Vemlidy    Other medication(s) to be shipped: No additional medications requested for fill at this time     Alison Clark, DOB: 1975/12/14  Phone: [phone] (home)       All above HIPAA information was verified with patient.     Was a Nurse, learning disability used for this call? No    Completed refill call assessment today to schedule patient's medication shipment from the [facility] Delivery Pharm[facility]  [phone]).  All relevant notes have been reviewed.     Specialty medication(s) and dose(s) confirmed: Regimen is correct and unchanged.   Changes to medications: [name] reports no changes at this time.  Changes to insurance: No  New side effects reported not previously addressed with a pharmacist or physician: None reported  Questions for the pharmacist: No    Confirmed patient received a Conservation officer, historic buildings and a Surveyor, mining with first shipment. The patient will receive a drug information handout for each medication shipped and additional FDA Medication Guides as required.       DISEASE/MEDICATION-SPECIFIC INFORMATION        N/A    SPECIALTY MEDICATION ADHERENCE     Medication Adherence    Patient reported X missed doses in the last month: 0  Specialty Medication: VEMLIDY 25 mg Tab tablet (tenofovir alafenamide)  Patient is on additional specialty medications: No              Were doses missed due to medication being on hold? No     VEMLIDY 25 mg Tab tablet (tenofovir alafenamide): 14 days of medicine on hand       REFERRAL TO PHARMACIST     Referral to the pharmacist: Not needed      [facility]     Shipping address confirmed in Epic.     Cost and Payment: Patient has a $0 copay, payment information is not required.    Delivery Scheduled: Yes, Expected medication delivery date: 11/27/2023.     Medication will be delivered via Next Day Courier to the prescription address in Epic WAM.    Lucie Ewings   [facility] Specialty and Home Delivery Pharm[facility]  Specialty Technician

## 2023-11-26 NOTE — Unmapped (Signed)
 Alison Clark 's Vemlidy   shipment will be rescheduled as a result of the medication is too soon to refill until 06/30.     I have reached out to the patient  via incoming phone call and communicated the delivery change. We will reschedule the medication for the delivery date that the patient agreed upon.  We have confirmed the delivery date as 07/01, via next day courier.

## 2023-11-26 NOTE — Unmapped (Signed)
 Alison Clark 's Vemlidy  shipment will be delayed as a result of the medication is too soon to refill until 11/30/23.     I have reached out to the patient  at [phone] and communicated the delay. We will wait for a call back from the patient to reschedule the delivery.  We have not confirmed the new delivery date.

## 2023-11-30 MED FILL — VEMLIDY 25 MG TABLET: ORAL | 30 days supply | Qty: 30 | Fill #4

## 2023-12-15 NOTE — Unmapped (Signed)
 12/15/2023 Opened in error SMT

## 2023-12-23 NOTE — Unmapped (Signed)
 [facility] Specialty and Home Delivery Pharmacy Clinical Assessment & Refill Coordination Note    Alison Clark, DOB: 1976/02/18  Phone: [phone] (home)     All above HIPAA information was verified with patient.     Was a Nurse, learning disability used for this call? No    Specialty Medication(s):   Infectious Disease: Vemlidy      Current Medications[1]     Changes to medications: [name] reports no changes at this time.    Medication list has been reviewed and updated in Epic: Yes    Allergies[2]    Changes to allergies: No    Allergies have been reviewed and updated in Epic: Yes    SPECIALTY MEDICATION ADHERENCE     Vemlidy  25 mg: 7 days of medicine on hand       Medication Adherence    Patient reported X missed doses in the last month: 0  Specialty Medication: Vemlidy  25mg  QD  Patient is on additional specialty medications: No  Informant: patient          Specialty medication(s) dose(s) confirmed: Regimen is correct and unchanged.     Are there any concerns with adherence? No    Adherence counseling provided? Not needed    CLINICAL MANAGEMENT AND INTERVENTION      Clinical Benefit Assessment:    Do you feel the medicine is effective or helping your condition? Patient declined to answer    Clinical Benefit counseling provided? Labs from 11/2023 show evidence of clinical benefit    Adverse Effects Assessment:    Are you experiencing any side effects? No    Are you experiencing difficulty administering your medicine? No    Quality of Life Assessment:    Quality of Life    Rheumatology  Oncology  Dermatology  Cystic Fibrosis          How many days over the past month did your HBV  keep you from your normal activities? For example, brushing your teeth or getting up in the morning. Patient declined to answer    Have you discussed this with your provider? Not needed    Acute Infection Status:    Acute infections noted within Epic:  No active infections    Patient reported infection: None    Therapy Appropriateness:    Is therapy appropriate based on current medication list, adverse reactions, adherence, clinical benefit and progress toward achieving therapeutic goals? Yes, therapy is appropriate and should be continued     Clinical Intervention:    Was an intervention completed as part of this clinical assessment? No    DISEASE/MEDICATION-SPECIFIC INFORMATION      N/A    Hepatitis B: Not Applicable      HEPATITIS B AND ASSOCIATED LABS:       Lab Results   Component Value Date/Time    HBVDNAQT Not Detected 11/09/2023 10:51 AM    HBVDNAQT Not Detected 07/28/2023 10:43 AM    HBVDNAQT Detected (A) 10/06/2022 10:16 AM    HBVDNACP 16 (H) 10/06/2022 10:16 AM    HBVDNACP <10 (H) 01/20/2022 11:24 AM    HBVD10 1.20 (H) 10/06/2022 10:16 AM    HBVD10  01/20/2022 11:24 AM      Comment:      <1.00 log    HBEAG Negative 07/28/2023 10:43 AM    HBEAG Negative 10/06/2022 10:16 AM    HBSAG (A) 11/09/2023 10:51 AM     For final results see Hepatitis B Surface Antigen Neutralization    HBSAG (A) 10/06/2022  10:16 AM     For final results see Hepatitis B Surface Antigen Neutralization    HBSAG (A) 01/20/2022 11:24 AM     For final results see Hepatitis B Surface Antigen Neutralization    HEPBSAB Nonreactive 11/09/2023 10:51 AM    HEPBSAB Nonreactive 10/06/2022 10:16 AM    HEPBSAB Nonreactive 01/20/2022 11:24 AM    ALT 18 11/09/2023 10:51 AM    ALT 15 07/28/2023 10:43 AM    ALT 17 03/17/2023 10:25 AM    AST 18 11/09/2023 10:51 AM    CREATININE 0.61 11/09/2023 10:51 AM    CREATININE 0.66 07/28/2023 10:43 AM    CREATININE 0.58 03/17/2023 10:25 AM         PATIENT SPECIFIC NEEDS     Does the patient have any physical, cognitive, or cultural barriers? No    Is the patient high risk? No    Does the patient require physician intervention or other additional services (i.e., nutrition, smoking cessation, social work)? No    Does the patient have an additional or emergency contact listed in their chart? Yes    SOCIAL DETERMINANTS OF HEALTH     At the [facility], we have learned that life circumstances - like trouble affording food, housing, utilities, or transportation can affect the health of many of our patients.   That is why we wanted to ask: are you currently experiencing any life circumstances that are negatively impacting your health and/or quality of life? Patient declined to answer    Social Drivers of Health     Food Insecurity: No Food Insecurity (08/12/2022)    Hunger Vital Sign     Worried About Running Out of Food in the Last Year: Never true     Ran Out of Food in the Last Year: Never true   Tobacco Use: Low Risk  (11/09/2023)    Patient History     Smoking Tobacco Use: Never     Smokeless Tobacco Use: Never     Passive Exposure: Never   Transportation Needs: No Transportation Needs (07/26/2018)    Received from [facility] - Transportation     Lack of Transportation (Medical): No     Lack of Transportation (Non-Medical): No   Alcohol Use: Not At Risk (10/21/2018)    Received from [facility]    AUDIT-C     Q1: How often do you have a drink containing alcohol?: Never     Average Number of Drinks: Not on file     Frequency of Binge Drinking: Not on file   Housing: Not on file   Physical Activity: Inactive (07/26/2018)    Received from [facility]    Exercise Vital Sign     On average, how many days per week do you engage in moderate to strenuous exercise (like a brisk walk)?: 0 days     On average, how many minutes do you engage in exercise at this level?: 0 min   Utilities: Not on file   Stress: No Stress Concern Present (07/26/2018)    Received from [facility] - Occupational Stress Questionnaire     Feeling of Stress : Not at all   Interpersonal Safety: Not At Risk (11/09/2023)    Interpersonal Safety     Unsafe Where You Currently Live: No     Physically Hurt by Anyone: No     Abused by Anyone: No   Substance  Use: Not on file (04/07/2023)   Intimate Partner Violence: Not At Risk (07/26/2018)    Received from [facility]    Humiliation, Afraid, Rape, and Kick questionnaire     Within the last year, have you been afraid of your partner or ex-partner?: No     Within the last year, have you been humiliated or emotionally abused in other ways by your partner or ex-partner?: No     Within the last year, have you been kicked, hit, slapped, or otherwise physically hurt by your partner or ex-partner?: No     Within the last year, have you been raped or forced to have any kind of sexual activity by your partner or ex-partner?: No   Social Connections: Moderately Integrated (07/26/2018)    Received from [facility]    Social Connection and Isolation Panel     In a typical week, how many times do you talk on the phone with family, friends, or neighbors?: More than three times a week     How often do you get together with friends or relatives?: More than three times a week     How often do you attend church or religious services?: 1 to 4 times per year     Do you belong to any clubs or organizations such as church groups, unions, fraternal or athletic groups, or school groups?: No     How often do you attend meetings of the clubs or organizations you belong to?: Never     Are you married, widowed, divorced, separated, never married, or living with a partner?: Married   Physicist, medical Strain: Low Risk  (07/26/2018)    Received from [facility]    Overall Financial Resource Strain (CARDIA)     Difficulty of Paying Living Expenses: Not hard at all   Health Literacy: Low Risk  (02/18/2022)    Health Literacy     : Never   Internet Connectivity: Not on file       Would you be willing to receive help with any of the needs that you have identified today? Not applicable       SHIPPING     Specialty Medication(s) to be Shipped:   Infectious Disease: Vemlidy     Other medication(s) to be shipped: No additional medications requested for fill at this time     Changes to insurance: No    Cost and Payment: Patient has a $0 copay, payment information is not required.    Delivery Scheduled: Yes, Expected medication delivery date: 12/30/23.     Medication will be delivered via Same Day Courier to the confirmed prescription address in [facility].    The patient will receive a drug information handout for each medication shipped and additional FDA Medication Guides as required.  Verified that patient has previously received a Conservation officer, historic buildings and a Surveyor, mining.    The patient or caregiver noted above participated in the development of this care plan and knows that they can request review of or adjustments to the care plan at any time.      All of the patient's questions and concerns have been addressed.    Aleck CHRISTELLA Gaskins, PharmD   [facility] Specialty and Home Delivery Pharmacy Specialty Pharmacist       [1]   Current Outpatient Medications   Medication Sig Dispense Refill    tenofovir  alafenamide (VEMLIDY ) 25 mg Tab tablet Take 1 tablet (25 mg total) by mouth daily. Take with food. 90  tablet 1    triamcinolone  (KENALOG ) 0.1 % ointment Apply topically two (2) times a day. 80 g 2     No current facility-administered medications for this visit.   [2] No Known Allergies

## 2023-12-30 MED FILL — VEMLIDY 25 MG TABLET: ORAL | 30 days supply | Qty: 30 | Fill #5

## 2024-01-13 DIAGNOSIS — B181 Chronic viral hepatitis B without delta-agent: Principal | ICD-10-CM

## 2024-01-13 MED ORDER — VEMLIDY 25 MG TABLET
ORAL_TABLET | Freq: Every day | ORAL | 1 refills | 90.00000 days
Start: 2024-01-13 — End: ?

## 2024-01-15 MED ORDER — VEMLIDY 25 MG TABLET
ORAL_TABLET | Freq: Every day | ORAL | 3 refills | 90.00000 days | Status: CP
Start: 2024-01-15 — End: ?
  Filled 2024-01-27: qty 30, 30d supply, fill #0

## 2024-01-15 NOTE — Unmapped (Signed)
 Refill request for Vemlidy     Last clinic visit 11/09/23 with labs    Will authorize refill at this time

## 2024-01-20 NOTE — Unmapped (Signed)
 [facility] Specialty and Home Delivery Pharmacy Refill Coordination Note    Specialty Medication(s) to be Shipped:   Infectious Disease: Vemlidy     Other medication(s) to be shipped: No additional medications requested for fill at this time    Specialty Medications not needed at this time: N/A     Alison Clark, DOB: [birth year]  Phone: [phone] (home)       All above HIPAA information was verified with patient.     Was a Nurse, learning disability used for this call? No    Completed refill call assessment today to schedule patient's medication shipment from the [facility] and Home Delivery Pharmacy  [phone]).  All relevant notes have been reviewed.     Specialty medication(s) and dose(s) confirmed: Regimen is correct and unchanged.   Changes to medications: [name] reports no changes at this time.  Changes to insurance: No  New side effects reported not previously addressed with a pharmacist or physician: None reported  Questions for the pharmacist: No    Confirmed patient received a Conservation officer, historic buildings and a Surveyor, mining with first shipment. The patient will receive a drug information handout for each medication shipped and additional FDA Medication Guides as required.       DISEASE/MEDICATION-SPECIFIC INFORMATION        N/A    SPECIALTY MEDICATION ADHERENCE     Medication Adherence    Patient reported X missed doses in the last month: 0  Specialty Medication: VEMLIDY  25 mg Tab tablet (tenofovir  alafenamide)  Patient is on additional specialty medications: No              Were doses missed due to medication being on hold? No     VEMLIDY  25 mg Tab tablet (tenofovir  alafenamide): 8-9 days of medicine on hand       REFERRAL TO PHARMACIST     Referral to the pharmacist: Not needed      [facility]     Shipping address confirmed in Epic.     Cost and Payment: Patient has a $0 copay, payment information is not required.    Delivery Scheduled: Yes, Expected medication delivery date: 01/27/2024.     Medication will be delivered via Same Day Courier to the prescription address in Epic WAM.    Alison Clark   [facility] Specialty and Home Delivery Pharmacy  Specialty Technician

## 2024-02-25 NOTE — Unmapped (Signed)
 [facility] Specialty and Home Delivery Pharmacy Refill Coordination Note    Specialty Medication(s) to be Shipped:   Infectious Disease: Vemlidy     Other medication(s) to be shipped: No additional medications requested for fill at this time    Specialty Medications not needed at this time: N/A     Alison Clark, DOB: [birth year]  Phone: [phone] (home)       All above HIPAA information was verified with patient.     Was a Nurse, learning disability used for this call? No    Completed refill call assessment today to schedule patient's medication shipment from the [facility] and Home Delivery Pharmacy  [phone]).  All relevant notes have been reviewed.     Specialty medication(s) and dose(s) confirmed: Regimen is correct and unchanged.   Changes to medications: [name] reports no changes at this time.  Changes to insurance: No  New side effects reported not previously addressed with a pharmacist or physician: None reported  Questions for the pharmacist: No    Confirmed patient received a Conservation officer, historic buildings and a Surveyor, mining with first shipment. The patient will receive a drug information handout for each medication shipped and additional FDA Medication Guides as required.       DISEASE/MEDICATION-SPECIFIC INFORMATION        N/A    SPECIALTY MEDICATION ADHERENCE     Medication Adherence    Patient reported X missed doses in the last month: 0  Specialty Medication: VEMLIDY  25 mg Tab tablet (tenofovir  alafenamide)  Patient is on additional specialty medications: No              Were doses missed due to medication being on hold? No    VEMLIDY  25 mg Tab tablet (tenofovir  alafenamide) 5 days of medicine on hand       REFERRAL TO PHARMACIST     Referral to the pharmacist: Not needed      [facility]     Shipping address confirmed in Epic.     Cost and Payment: Patient has a $0 copay, payment information is not required.    Delivery Scheduled: Yes, Expected medication delivery date: 02/29/2024.     Medication will be delivered via Same Day Courier to the prescription address in Epic WAM.    Nelida Winfred [facility]  Specialty Technician

## 2024-02-29 MED FILL — VEMLIDY 25 MG TABLET: ORAL | 30 days supply | Qty: 30 | Fill #1

## 2024-03-17 ENCOUNTER — Ambulatory Visit: Admit: 2024-03-17 | Discharge: 2024-03-18 | Payer: BLUE CROSS/BLUE SHIELD

## 2024-03-17 DIAGNOSIS — R87619 Unspecified abnormal cytological findings in specimens from cervix uteri: Principal | ICD-10-CM

## 2024-03-17 DIAGNOSIS — Z1231 Encounter for screening mammogram for malignant neoplasm of breast: Principal | ICD-10-CM

## 2024-03-17 DIAGNOSIS — E559 Vitamin D deficiency, unspecified: Principal | ICD-10-CM

## 2024-03-17 DIAGNOSIS — Z0000 Encounter for general adult medical examination without abnormal findings: Principal | ICD-10-CM

## 2024-03-17 DIAGNOSIS — R030 Elevated blood-pressure reading, without diagnosis of hypertension: Principal | ICD-10-CM

## 2024-03-17 DIAGNOSIS — R928 Other abnormal and inconclusive findings on diagnostic imaging of breast: Principal | ICD-10-CM

## 2024-03-17 DIAGNOSIS — N951 Menopausal and female climacteric states: Principal | ICD-10-CM

## 2024-03-17 LAB — COMPREHENSIVE METABOLIC PANEL
ALBUMIN: 4.6 g/dL (ref 3.4–5.0)
ALKALINE PHOSPHATASE: 107 U/L (ref 46–116)
ALT (SGPT): 21 U/L (ref 10–49)
ANION GAP: 12 mmol/L (ref 5–14)
AST (SGOT): 19 U/L (ref <=34)
BILIRUBIN TOTAL: 0.7 mg/dL (ref 0.3–1.2)
BLOOD UREA NITROGEN: 11 mg/dL (ref 9–23)
BUN / CREAT RATIO: 19
CALCIUM: 10.5 mg/dL — ABNORMAL HIGH (ref 8.7–10.4)
CHLORIDE: 102 mmol/L (ref 98–107)
CO2: 32.4 mmol/L — ABNORMAL HIGH (ref 20.0–31.0)
CREATININE: 0.59 mg/dL (ref 0.55–1.02)
EGFR CKD-EPI (2021) FEMALE: >90 mL/min/1.73m2 (ref >=60)
GLUCOSE RANDOM: 103 mg/dL — ABNORMAL HIGH (ref 70–99)
POTASSIUM: 4.2 mmol/L (ref 3.4–4.8)
PROTEIN TOTAL: 8.3 g/dL — ABNORMAL HIGH (ref 5.7–8.2)
SODIUM: 146 mmol/L — ABNORMAL HIGH (ref 135–145)

## 2024-03-17 LAB — HEPATITIS B SURFACE ANTIBODY
HEPATITIS B SURFACE ANTIBODY QUANT: <8 m[IU]/mL (ref <8.00)
HEPATITIS B SURFACE ANTIBODY: NONREACTIVE

## 2024-03-17 LAB — CBC
HEMATOCRIT: 42.6 % (ref 34.0–44.0)
HEMOGLOBIN: 14.5 g/dL (ref 11.3–14.9)
MEAN CORPUSCULAR HEMOGLOBIN CONC: 34 g/dL (ref 32.0–36.0)
MEAN CORPUSCULAR HEMOGLOBIN: 25.7 pg — ABNORMAL LOW (ref 25.9–32.4)
MEAN CORPUSCULAR VOLUME: 75.7 fL — ABNORMAL LOW (ref 77.6–95.7)
MEAN PLATELET VOLUME: 7.9 fL (ref 6.8–10.7)
PLATELET COUNT: 257 10*9/L (ref 150–450)
RED BLOOD CELL COUNT: 5.62 10*12/L — ABNORMAL HIGH (ref 3.95–5.13)
RED CELL DISTRIBUTION WIDTH: 13.3 % (ref 12.2–15.2)
WBC ADJUSTED: 5.4 10*9/L (ref 3.6–11.2)

## 2024-03-17 LAB — HEPATITIS B SURFACE ANTIGEN

## 2024-03-17 LAB — LIPID PANEL
CHOLESTEROL: 216 mg/dL — ABNORMAL HIGH (ref <200)
HDL CHOLESTEROL: 62 mg/dL (ref >50)
LDL CHOLESTEROL CALCULATED: 118 mg/dL — ABNORMAL HIGH (ref <100)
NON-HDL CHOLESTEROL: 154 mg/dL — ABNORMAL HIGH (ref <130)
TRIGLYCERIDES: 218 mg/dL — ABNORMAL HIGH (ref <150)

## 2024-03-17 LAB — HEP.B NEUTRALIZATION: HBSAG NEUT: POSITIVE — AB

## 2024-03-17 NOTE — Unmapped (Signed)
 [facility] at [facility] Visit    Patient ID: Alison Clark is a 48 y.o. female who is here for:  Annual Exam      Assessment/Plan:       Diagnosis ICD-10-CM Associated Orders   1. Annual physical exam  Z00.00 Lipid Panel      2. Breast cancer screening by mammogram  Z12.31 Mammo Diagnostic Bilateral Tomo      3. Abnormal mammogram  R92.8 Mammo Diagnostic Bilateral Tomo      4. Abnormal cervical Papanicolaou smear, unspecified abnormal pap finding  R87.619       5. Perimenopausal  N95.1       6. Vitamin D deficiency  E55.9 Vitamin D 25 Hydroxy (25OH D2 + D3)      7. White coat syndrome without diagnosis of hypertension  R03.0           Assessment & Plan  Perimenopausal symptoms  Irregular menses, hot flashes, and weight gain consistent with perimenopausal symptoms. Explained perimenopause as a transition with irregular periods and menopause as no periods for a year. Discussed hormonal changes causing symptoms.    Abnormal cervical cytology  Follow-up needed for abnormal cervical cytology. Last Pap smear was unclear, follow-up scheduled with GYN provider.  - Call GYN provider to schedule follow-up Pap smear in January.    Breast cancer screening  Due for diagnostic mammogram. Previous mammogram normal, but history necessitates upgraded diagnostic mammogram. Annual mammograms recommended for women over 40.  - Order diagnostic mammogram.  - Provide radiology contact information for scheduling.    Colon cancer screening  Colon cancer screening up to date with Cologuard completed last year.    General Health Maintenance  Discussed general health maintenance including flu vaccination and blood pressure monitoring. Flu shot unavailable today due to shortage. Blood pressure well-controlled at home.    Laboratory tests  Routine lab tests ordered. In-office lab unavailable today.  - Complete labs across the street.    Patient Instructions   [facility]  [address]  [place], [place] [address]    Main [phone]      [facility] (Imaging and Radiology)    [address]  First Floor  [place], [place] [address]    Phone: [phone]       Medication risks and benefits provided to patient.     -- Patient verbalized an understanding of today's assessment and recommendations, as well as the purpose of ongoing medications.    Return in about 1 year (around 03/17/2025) for Annual physical.            [place] Breeanne Oblinger, DO  [facility] at [place], [place]      Subjective:     HPI:    History of Present Illness  Alison Clark is a 48 year old female who presents for an annual physical exam and reports amenorrhea for three months.    She has experienced amenorrhea for the past three months, with her last menstrual period occurring three months ago. She has also noticed a weight gain of approximately seven to eight pounds, primarily around her abdomen, which she finds unusual as she did not experience this after having her two children.    She reports changes in her menstrual cycle and hot flashes. She feels warmer than usual, noting a change in her tolerance to cold, as she used to be the first to put on a jacket but now is the last.  She has a history of undergoing diagnostic mammograms due to previous findings that required monitoring. She is due for a follow-up Pap smear in January, as advised by her gynecologist last year. She has not yet scheduled this appointment.    She is under the care of a liver specialist for chronic hepatitis B, requiring lab tests every three months. She is up to date with colon cancer screening, having completed a Cologuard test last year.    No current belly pain. She reports experiencing hot flashes and changes in her body temperature.        Review of Systems   Constitutional:  Negative for chills and fever.   HENT:  Negative for congestion and sore throat.    Eyes:  Negative for pain.   Respiratory:  Negative for cough and shortness of breath.    Cardiovascular:  Negative for chest pain.   Gastrointestinal:  Negative for abdominal pain and blood in stool.   Genitourinary:  Negative for dysuria.   Musculoskeletal:  Negative for back pain.   Skin:  Negative for rash.   Neurological:  Negative for weakness, numbness and headaches.   Psychiatric/Behavioral:  Negative for dysphoric mood.           HISTORY:     History was reviewed and electronic chart updated:     Past Medical History[1]    Medications Prior to Visit[2]     Allergies[3]    Social History [4]    Objective:     Visit Vitals  BP 156/95 (BP Site: L Arm, BP Position: Sitting, BP Cuff Size: Large)   Pulse 66   Temp 37 ??C (98.6 ??F) (Oral)   Wt 54.9 kg (121 lb)   BMI 23.63 kg/m??       Blood Pressure for the past 24 hrs:   BP   03/17/24 0920 156/95       Vitals:    03/17/24 0920   BP Site: L Arm   BP Position: Sitting   BP Cuff Size: Large          PHYSICAL EXAM:        Physical Exam  Vitals and nursing note reviewed.   Constitutional:       General: She is not in acute distress.     Appearance: Normal appearance.   HENT:      Head: Normocephalic and atraumatic.      Right Ear: Tympanic membrane, ear canal and external ear normal.      Left Ear: Tympanic membrane, ear canal and external ear normal.      Nose: No congestion.      Mouth/Throat:      Mouth: Mucous membranes are moist. No oral lesions.      Dentition: Normal dentition.      Pharynx: No oropharyngeal exudate or posterior oropharyngeal erythema.      Tonsils: No tonsillar exudate.   Eyes:      General: Lids are normal.         Right eye: No discharge.         Left eye: No discharge.      Conjunctiva/sclera: Conjunctivae normal.      Pupils: Pupils are equal, round, and reactive to light.   Neck:      Thyroid: No thyroid mass or thyromegaly.   Cardiovascular:      Rate and Rhythm: Normal rate and regular rhythm.      Heart sounds: Normal heart sounds.   Pulmonary:  Effort: Pulmonary effort is normal. No respiratory distress. Breath sounds: Normal breath sounds.   Abdominal:      General: Bowel sounds are normal. There is no distension.      Palpations: Abdomen is soft.      Tenderness: There is no abdominal tenderness.   Musculoskeletal:         General: Normal range of motion.      Cervical back: Normal range of motion and neck supple.      Right lower leg: No edema.      Left lower leg: No edema.   Skin:     General: Skin is warm and dry.      Findings: No rash.   Neurological:      General: No focal deficit present.      Mental Status: She is alert and oriented to person, place, and time.   Psychiatric:         Mood and Affect: Mood normal.         Behavior: Behavior normal.             Note - This record has been created using [employer]. Chart creation errors have been sought, but may not always have been located. Such creation errors do not reflect on the standard of medical care.       [1]   Past Medical History:  Diagnosis Date    Chronic hepatitis B    (CMS-HCC)     Eczema     Fibroid     ovaries    Hypertension    [2]   Outpatient Medications Prior to Visit   Medication Sig Dispense Refill    tenofovir  alafenamide (VEMLIDY ) 25 mg Tab tablet Take 1 tablet (25 mg total) by mouth daily. Take with food. 90 tablet 3    triamcinolone  (KENALOG ) 0.1 % ointment Apply topically two (2) times a day. 80 g 2     No facility-administered medications prior to visit.   [3] No Known Allergies  [4]   Social History  Socioeconomic History    Marital status: Married   Tobacco Use    Smoking status: Never     Passive exposure: Never    Smokeless tobacco: Never   Vaping Use    Vaping status: Never Used   Substance and Sexual Activity    Alcohol use: Not Currently    Drug use: Never    Sexual activity: Yes     Partners: Male   Other Topics Concern    Do you use sunscreen? No    Tanning bed use? No    Are you easily burned? No    Excessive sun exposure? No    Blistering sunburns? No     Social Drivers of Psychologist, prison and probation services Strain: Low Risk  (07/26/2018)    Received from [facility]    Overall Financial Resource Strain (CARDIA)     Difficulty of Paying Living Expenses: Not hard at all   Food Insecurity: No Food Insecurity (08/12/2022)    Hunger Vital Sign     Worried About Running Out of Food in the Last Year: Never true     Ran Out of Food in the Last Year: Never true   Transportation Needs: No Transportation Needs (07/26/2018)    Received from [facility] - Transportation     Lack of Transportation (Medical): No     Lack of Transportation (Non-Medical): No   Physical Activity:  Inactive (07/26/2018)    Received from [facility]    Exercise Vital Sign     On average, how many days per week do you engage in moderate to strenuous exercise (like a brisk walk)?: 0 days     On average, how many minutes do you engage in exercise at this level?: 0 Alison   Stress: No Stress Concern Present (07/26/2018)    Received from [facility] - Occupational Stress Questionnaire     Feeling of Stress : Not at all   Social Connections: Moderately Integrated (07/26/2018)    Received from [facility]    Social Connection and Isolation Panel     In a typical week, how many times do you talk on the phone with family, friends, or neighbors?: More than three times a week     How often do you get together with friends or relatives?: More than three times a week     How often do you attend church or religious services?: 1 to 4 times per year     Do you belong to any clubs or organizations such as church groups, unions, fraternal or athletic groups, or school groups?: No     How often do you attend meetings of the clubs or organizations you belong to?: Never     Are you married, widowed, divorced, separated, never married, or living with a partner?: Married

## 2024-03-17 NOTE — Unmapped (Addendum)
 [facility]  [address]  [place], [place] [address]    Main Phone:[phone]      [facility] (Imaging and Radiology)    [address]  First Floor  [place], [place] [address]    Phone: [phone]

## 2024-03-18 LAB — HEPATITIS B DNA, QUANTITATIVE, PCR
HBV DNA QUANT: DETECTED — AB
HBV DNA: <10 [IU]/mL — ABNORMAL HIGH (ref <=0)

## 2024-03-18 LAB — VITAMIN D 25 HYDROXY: VITAMIN D, TOTAL (25OH): 20.3 ng/mL (ref 20.0–80.0)

## 2024-03-22 NOTE — Unmapped (Signed)
 [facility] Refill Coordination Note    Specialty Medication(s) to be Shipped:   Infectious Disease: VEMLIDY  25 mg Tab tablet (tenofovir  alafenamide)    Other medication(s) to be shipped: No additional medications requested for fill at this time    Specialty Medications not needed at this time: N/A     Alison Clark, DOB: 12/18/1975  Phone: [phone] (home)       All above HIPAA information was verified with patient.     Was a Nurse, learning disability used for this call? No    Completed refill call assessment today to schedule patient's medication shipment from the [facility]  [phone]).  All relevant notes have been reviewed.     Specialty medication(s) and dose(s) confirmed: Regimen is correct and unchanged.   Changes to medications: [name] reports no changes at this time.  Changes to insurance: No  New side effects reported not previously addressed with a pharmacist or physician: None reported  Questions for the pharmacist: No    Confirmed patient received a Conservation officer, historic buildings and a Surveyor, mining with first shipment. The patient will receive a drug information handout for each medication shipped and additional FDA Medication Guides as required.       DISEASE/MEDICATION-SPECIFIC INFORMATION        N/A    SPECIALTY MEDICATION ADHERENCE     Medication Adherence    Patient reported X missed doses in the last month: 0  Specialty Medication: VEMLIDY  25 mg Tab tablet (tenofovir  alafenamide)  Patient is on additional specialty medications: No  Informant: patient              Were doses missed due to medication being on hold? No    VEMLIDY  25 mg Tab tablet (tenofovir  alafenamide): 9 days of medicine on hand       REFERRAL TO PHARMACIST     Referral to the pharmacist: Not needed      [facility]     Shipping address confirmed in Epic.     Cost and Payment: Patient has a $0 copay, payment information is not required.    Delivery Scheduled: Yes, Expected medication delivery date: 10/28.     Medication will be delivered via Next Day Courier to the prescription address in Epic WAM.    [name] [facility]

## 2024-03-28 MED FILL — VEMLIDY 25 MG TABLET: ORAL | 30 days supply | Qty: 30 | Fill #2

## 2024-04-05 ENCOUNTER — Inpatient Hospital Stay: Admit: 2024-04-05 | Discharge: 2024-04-05 | Payer: BLUE CROSS/BLUE SHIELD

## 2024-04-20 NOTE — Progress Notes (Signed)
 [facility] Refill Coordination Note    Specialty Medication(s) to be Shipped:   Infectious Disease: VEMLIDY  25 mg Tab tablet (tenofovir  alafenamide)    Other medication(s) to be shipped: No additional medications requested for fill at this time    Specialty Medications not needed at this time: N/A     Alison Clark, DOB: 1975-10-06  Phone: [phone] (home)       All above HIPAA information was verified with patient.     Was a nurse, learning disability used for this call? No    Completed refill call assessment today to schedule patient's medication shipment from the [facility]  [phone]).  All relevant notes have been reviewed.     Specialty medication(s) and dose(s) confirmed: Regimen is correct and unchanged.   Changes to medications: Alison Clark reports no changes at this time.  Changes to insurance: No  New side effects reported not previously addressed with a pharmacist or physician: None reported  Questions for the pharmacist: No    Confirmed patient received a Conservation Officer, Historic Buildings and a Surveyor, Mining with first shipment. The patient will receive a drug information handout for each medication shipped and additional FDA Medication Guides as required.       DISEASE/MEDICATION-SPECIFIC INFORMATION        N/A    SPECIALTY MEDICATION ADHERENCE     Medication Adherence    Patient reported X missed doses in the last month: 0  Specialty Medication: VEMLIDY  25 mg Tab tablet (tenofovir  alafenamide)  Patient is on additional specialty medications: No  Informant: patient              Were doses missed due to medication being on hold? No    VEMLIDY  25 mg Tab tablet (tenofovir  alafenamide): 7-10 days of medicine on hand       REFERRAL TO PHARMACIST     Referral to the pharmacist: Not needed      [facility]     Shipping address confirmed in Epic.     Cost and Payment: Patient has a $0 copay, payment information is not required.    Delivery Scheduled: Yes, Expected medication delivery date: 11/25.     Medication will be delivered via Next Day Courier to the prescription address in Epic WAM.    Alison Clark [facility]

## 2024-04-25 MED FILL — VEMLIDY 25 MG TABLET: ORAL | 30 days supply | Qty: 30 | Fill #3

## 2024-05-17 ENCOUNTER — Encounter
Admit: 2024-05-17 | Discharge: 2024-05-17 | Payer: BLUE CROSS/BLUE SHIELD | Attending: Obstetrics & Gynecology | Primary: Obstetrics & Gynecology

## 2024-05-17 NOTE — Progress Notes (Signed)
 ASSESSMENT/PLAN:  Alison Clark is a 48 y.o. G2P2002 was seen today for annual exam with pap smear.     Diagnosis ICD-10-CM Associated Orders   1. Atypical endocervical cells on cervical Papanicolaou smear  R87.619 Pap Test      2. Well woman exam with routine gynecological exam  Z01.419       3. White coat syndrome without diagnosis of hypertension  R03.0         Atypical Endocervical cells on Pap  - pap & HPV testing today based on recommendation of dysplasia specialist.  If normal, repeat in 3 years.      Reproductive life planning:  - given irregular menses, thinks she is perimenopausal - LP 12/2022  - participatory guidance regarding perimenopause, menopause, possible symptoms - has hot flashes but tolerates them.  Will ask mom when she went through menopause. Denies any other bothersome symptoms and not interested in management at this time.     Preventive screening:  -Breast cancer: MMG normal in November 2025  - Colon cancer: FIT testing w/PCP last year 10/24 and not due again until 03/2026  - Skin cancer:encouraged avoiding peak sun exposure hours and using sun screen  - Osteoporosis prevention: reviewed importance of adequate calcium and vitamin D as well as weight bearing activities for maintaining bone health and balance  - Lab screening: deferred to PCP    Weight/Exercise:  - Patient counseled mz:[id number] of a healthy diet and exercise with recommendations for 30 minutes, 5 days/week. She is currently just walking occasionally. Encouraged more dedicated exercise.    White Coat Hypertension  BP 170/82 in office and asymptomatic.  Patient checks BP weekly at home with consistent results in the 110s-120s/70-80s. Pt should continue to monitor monthly and follow up with PCP if pressures elevate beyond her current normal.     Will follow-up for annual exam in 1 year or earlier as needed.  ______________________________________________    SUBJECTIVE:  CC: abnormal pap     HPI: Alison Clark is a 48 y.o. female G2P2002 Patient's last menstrual period was 12/04/2023 (exact date)..  She is unsure when her mother went through menopause but will ask.  Has hot flashes occasionally but denies other symptoms of menopause like vaginal dryness or dyspareunia.  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 Had a normal pap last year but at recommendation of dysplasia specialist, should repeat this year and if normal, in 3 years.       Elevated BP  Pt with BP of 170/82 in office.  Checks her BP weekly at home and it is always 110-120/70-80s.  Has prior diagnosis of white coat hypertension.  Denies dizziness, chest pain, SOB.     PERTINENT GYN HISTORY:  > Contraception: tubal ligation   > Last pap & hx: per above  > Last Mammogram: 11/25       > Domestic Violence: no      OB HISTORY:   OB History   Gravida Para Term Preterm AB Living   2 2 2  0 0 2   SAB IAB Ectopic Molar Multiple Live Births   0 0 0 0 0 2   Obstetric Comments   Reports no issues with pregnancy     PAST MEDICAL HISTORY:   Past Medical History:   Diagnosis Date    Chronic hepatitis B    (CMS-HCC)     Eczema     Fibroid  ovaries    Hypertension      PAST SURGICAL HISTORY:   Past Surgical History:   Procedure Laterality Date    BREAST BIOPSY Right     2023    CESAREAN SECTION      OOPHORECTOMY  2006    One ovary removed    OVARY SURGERY      had oopherectomy (one) due to a fibroid    SKIN GRAFT      on left leg at 6 years    TUBAL LIGATION       MEDICATIONS:   Current Outpatient Medications   Medication Sig Dispense Refill    tenofovir  alafenamide (VEMLIDY ) 25 mg Tab tablet Take 1 tablet (25 mg total) by mouth daily. Take with food. 90 tablet 3    triamcinolone  (KENALOG ) 0.1 % ointment Apply topically two (2) times a day. (Patient not taking: Reported on 05/17/2024) 80 g 2     No current facility-administered medications for this visit.     ALLERGIES: Patient has no known allergies.    SOCIAL HISTORY:   Social History Tobacco Use    Smoking status: Never     Passive exposure: Never    Smokeless tobacco: Never   Vaping Use    Vaping status: Never Used   Substance Use Topics    Alcohol use: Not Currently    Drug use: Never       REVIEW OF SYSTEMS:   Negative review of systems except as per HPI    OBJECTIVE:  BP (!) 170/82 (BP Position: Sitting, BP Cuff Size: Medium)  - Pulse 70  - Ht 152.4 cm (5')  - Wt 54.3 kg (119 lb 11.2 oz)  - LMP 12/04/2023 (Exact Date)  - BMI 23.38 kg/m??  Body mass index is 23.38 kg/m??.  GENERAL: alert, healthy, and thin  HEENT:NCAT  BREASTS:deferred  UROGENITAL  URETHRA:Normal  VULVA: No lesions.  VAGINA:normal ruggae  CERVIX:nuliparous appearing  UTERUS:normal size, contour, position, consistency, mobility, non-tender  ADNEXA:nontender, no masses  PERINEUM/ANUS:no lesions      LABS:  Results for orders placed or performed in visit on 03/17/24   CBC   Result Value Ref Range    WBC 5.4 3.6 - 11.2 10*9/L    RBC 5.62 (H) 3.95 - 5.13 10*12/L    HGB 14.5 11.3 - 14.9 g/dL    HCT 57.3 65.9 - 55.9 %    MCV 75.7 (L) 77.6 - 95.7 fL    MCH 25.7 (L) 25.9 - 32.4 pg    MCHC 34.0 32.0 - 36.0 g/dL    RDW 86.6 87.7 - 84.7 %    MPV 7.9 6.8 - 10.7 fL    Platelet 257 150 - 450 10*9/L   Comprehensive Metabolic Panel   Result Value Ref Range    Sodium 146 (H) 135 - 145 mmol/L    Potassium 4.2 3.4 - 4.8 mmol/L    Chloride 102 98 - 107 mmol/L    CO2 32.4 (H) 20.0 - 31.0 mmol/L    Anion Gap 12 5 - 14 mmol/L    BUN 11 9 - 23 mg/dL    Creatinine 9.40 9.44 - 1.02 mg/dL    BUN/Creatinine Ratio 19     eGFR CKD-EPI (2021) Female >90 >=60 mL/Alison/1.63m2    Glucose 103 (H) 70 - 99 mg/dL    Calcium 89.4 (H) 8.7 - 10.4 mg/dL    Albumin 4.6 3.4 - 5.0 g/dL    Total Protein 8.3 (H) 5.7 - 8.2  g/dL    Total Bilirubin 0.7 0.3 - 1.2 mg/dL    AST 19 <=65 U/L    ALT 21 10 - 49 U/L    Alkaline Phosphatase 107 46 - 116 U/L   Hepatitis B Surface Antibody   Result Value Ref Range    Hep B S Ab Nonreactive Nonreactive, [place]    Hep B Surf Ab Quant <8.00 <8.00 m(IU)/mL   Hepatitis B Surface Antigen   Result Value Ref Range    Hep B Surface Ag (A) Nonreactive     For final results see Hepatitis B Surface Antigen Neutralization   Hepatitis B DNA, Quantitative, PCR   Result Value Ref Range    HBV DNA Quant Detected (A) Not Detected    HBV DNA <10 (H) 0 IU/mL    HBV DNA Log10     Lipid Panel   Result Value Ref Range    Cholesterol, Total 216 (H) <200 mg/dL    Cholesterol, HDL 62 >50 mg/dL    Cholesterol, LDL, Calculated 118 (H) <100 mg/dL    Cholesterol, Non-HDL, Calculated 154 (H) <130 mg/dL    Triglycerides 781 (H) <150 mg/dL    Fasting Yes    Vitamin D 25 Hydroxy (25OH D2 + D3)   Result Value Ref Range    Vitamin D Total (25OH) 20.3 20.0 - 80.0 ng/mL   HEP.B NEUTRALIZATION   Result Value Ref Range    Hep B S Ag Neutralization (A) Reactive by screening assay;non-confirmable by neutralization assay     Hepatitis B Surface Antigen POSITIVE;confirmed by neutralization       Truman Drone, LPN as chaperone    Prior to the outpatient exam, consent was obtained from the patient prior to the sensitive portion of the exam. A PA student was not involved; but PA In training Elberta Finlay was

## 2024-05-19 DIAGNOSIS — R42 Dizziness and giddiness: Principal | ICD-10-CM

## 2024-05-19 MED ORDER — ONDANSETRON 4 MG DISINTEGRATING TABLET
ORAL_TABLET | Freq: Three times a day (TID) | 0 refills | 5.00000 days | Status: CP | PRN
Start: 2024-05-19 — End: 2024-05-24

## 2024-05-19 MED ORDER — MECLIZINE 25 MG TABLET
ORAL_TABLET | Freq: Three times a day (TID) | ORAL | 0 refills | 5.00000 days | Status: CP | PRN
Start: 2024-05-19 — End: 2024-05-24

## 2024-05-19 NOTE — Progress Notes (Signed)
 ASSESSMENT/PLAN:     Pt with Vertigo. Meclizine  and zofran  prescribed. Pt advised on signs and symptoms that would warrant returning to Urgent Care or Emergency Department. Pt voices understanding. Pt adivsed to return to Urgent Care or Emergency Department if symptoms persist or worsen.      Diagnosis ICD-10-CM Associated Orders   1. Vertigo  R42         Requested Prescriptions     Signed Prescriptions Disp Refills    meclizine  (ANTIVERT ) 25 mg tablet 15 tablet 0     Sig: Take 1 tablet (25 mg total) by mouth Three (3) times a day as needed for up to 5 days.    ondansetron  (ZOFRAN -ODT) 4 MG disintegrating tablet 15 tablet 0     Sig: Dissolve 1 tablet (4 mg total) in the mouth every eight (8) hours as needed for nausea for up to 5 days.     Instructions about new medications and side effects provided.  If any changes in chronic medications then new reconciled medication list is given to patient.     CHIEF COMPLAINT:     Chief Complaint   Patient presents with    Dizziness     When standing X 2 days and vomiting, nausea         SUBJECTIVE/HPI:   48 y.o. Female presents with dizziness x2 days. Pt states it worsens with positional changes. Pt states she had an episode of emesis yesterday but not today.    Past Medical History[1]  Allergies[2]  Social History[3]  Family History[4]    ROS:   Review of Systems   Neurological:  Positive for dizziness.     See Subjective/HPI  Medications, Allergies and Problem List personally reviewed in Epic today    OBJECTIVE:          05/19/24 1811   BP: 135/73   Pulse: 57   Resp: 18   Temp: 36.6 ??C (97.9 ??F)   SpO2: 95%   Weight: 53.7 kg (118 lb 6 oz)   Height: 152.4 cm (5')   PainSc: 0-No pain     Physical Exam  Vitals reviewed.   Constitutional:       General: She is not in acute distress.     Appearance: Normal appearance.   HENT:      Head: Normocephalic.      Right Ear: Tympanic membrane, ear canal and external ear normal.      Left Ear: Tympanic membrane, ear canal and external ear normal.      Nose: Nose normal.      Mouth/Throat:      Pharynx: Oropharynx is clear.   Eyes:      Conjunctiva/sclera: Conjunctivae normal.   Cardiovascular:      Rate and Rhythm: Normal rate and regular rhythm.      Heart sounds: Normal heart sounds.   Pulmonary:      Effort: Pulmonary effort is normal. No respiratory distress.      Breath sounds: Normal breath sounds.   Abdominal:      General: Abdomen is flat. Bowel sounds are normal.      Palpations: Abdomen is soft.      Tenderness: There is no abdominal tenderness.   Skin:     General: Skin is warm and dry.   Neurological:      General: No focal deficit present.      Mental Status: She is alert.      Cranial Nerves: Cranial nerves 2-12 are intact.  Sensory: Sensation is intact.      Motor: Motor function is intact.      Coordination: Coordination is intact.          No results found.     LABS/X-RAYS/EKG/MEDS:       No results found for this visit on 05/19/24.    No results found.      Follow-up with PCP      A copy of these instructions have been given to the patient or responsible adult who demonstrated the ability to learn, asked appropriate questions, and verbalized understanding of the plan of care.  There were no barriers to learning identified.    Please take the time to sign up for a MyChart Account. See sign up information in your After Visit Summary. You will be able to view lab results, make appointments, communicate with providers, and much more.       [1]   Past Medical History:  Diagnosis Date    Chronic hepatitis B    (CMS-HCC)     Eczema     Fibroid     ovaries    Hypertension    [2] No Known Allergies  [3]   Social History  Socioeconomic History    Marital status: Married     Spouse name: None    Number of children: None    Years of education: None    Highest education level: None   Tobacco Use    Smoking status: Never     Passive exposure: Never    Smokeless tobacco: Never   Vaping Use    Vaping status: Never Used   Substance and Sexual Activity    Alcohol use: Not Currently    Drug use: Never    Sexual activity: Yes     Partners: Male   Other Topics Concern    Do you use sunscreen? No    Tanning bed use? No    Are you easily burned? No    Excessive sun exposure? No    Blistering sunburns? No     Social Drivers of Health     Food Insecurity: No Food Insecurity (08/12/2022)    Hunger Vital Sign     Worried About Running Out of Food in the Last Year: Never true     Ran Out of Food in the Last Year: Never true   Tobacco Use: Low Risk (05/19/2024)    Patient History     Smoking Tobacco Use: Never     Smokeless Tobacco Use: Never     Passive Exposure: Never   Interpersonal Safety: Not At Risk (11/09/2023)    Interpersonal Safety     Unsafe Where You Currently Live: No     Physically Hurt by Anyone: No     Abused by Anyone: No   Health Literacy: Low Risk (02/18/2022)    Health Literacy     : Never   [4]   Family History  Problem Relation Age of Onset    Hepatitis Mother     Melanoma Neg Hx     Basal cell carcinoma Neg Hx     Squamous cell carcinoma Neg Hx     Breast cancer Neg Hx     Ovarian cancer Neg Hx     Colon cancer Neg Hx     Endometrial cancer Neg Hx

## 2024-05-20 NOTE — Progress Notes (Signed)
 [facility] Refill Coordination Note    Specialty Medication(s) to be Shipped:   Infectious Disease: VEMLIDY  25 mg Tab tablet (tenofovir  alafenamide)    Other medication(s) to be shipped: No additional medications requested for fill at this time    Specialty Medications not needed at this time: N/A     Alison Clark, DOB: April 02, 1976  Phone: [phone] (home)       All above HIPAA information was verified with patient.     Was a nurse, learning disability used for this call? No    Completed refill call assessment today to schedule patient's medication shipment from the [facility]  [phone]).  All relevant notes have been reviewed.     Specialty medication(s) and dose(s) confirmed: Regimen is correct and unchanged.   Changes to medications: [name] reports no changes at this time.  Changes to insurance: No  New side effects reported not previously addressed with a pharmacist or physician: None reported  Questions for the pharmacist: No    Confirmed patient received a Conservation Officer, Historic Buildings and a Surveyor, Mining with first shipment. The patient will receive a drug information handout for each medication shipped and additional FDA Medication Guides as required.       DISEASE/MEDICATION-SPECIFIC INFORMATION        N/A    SPECIALTY MEDICATION ADHERENCE     Medication Adherence    Patient reported X missed doses in the last month: 0  Specialty Medication: VEMLIDY  25 mg Tab tablet (tenofovir  alafenamide)  Patient is on additional specialty medications: No  Informant: patient              Were doses missed due to medication being on hold? No    VEMLIDY  25 mg Tab tablet (tenofovir  alafenamide): 8 days of medicine on hand     Specialty medication is an injection or given on a cycle: No    REFERRAL TO PHARMACIST     Referral to the pharmacist: Not needed      [facility]     Shipping address confirmed in Epic.     Cost and Payment: Patient has a $0 copay, payment information is not required.    Delivery Scheduled: Yes, Expected medication delivery date: 12/24.     Medication will be delivered via Next Day Courier to the prescription address in Epic WAM.    [name] [facility]

## 2024-05-24 MED FILL — VEMLIDY 25 MG TABLET: ORAL | 30 days supply | Qty: 30 | Fill #4

## 2024-06-15 NOTE — Progress Notes (Signed)
 [facility] Refill Coordination Note    Specialty Medication(s) to be Shipped:   Infectious Disease: VEMLIDY  25 mg Tab tablet (tenofovir  alafenamide)    Other medication(s) to be shipped: No additional medications requested for fill at this time    Specialty Medications not needed at this time: N/A     Alison Clark, DOB: [birth year]  Phone: [phone] (home)       All above HIPAA information was verified with patient.     Was a nurse, learning disability used for this call? No    Completed refill call assessment today to schedule patient's medication shipment from the [facility]  [phone]).  All relevant notes have been reviewed.     Specialty medication(s) and dose(s) confirmed: Regimen is correct and unchanged.   Changes to medications: [name] reports no changes at this time.  Changes to insurance: No  New side effects reported not previously addressed with a pharmacist or physician: None reported  Questions for the pharmacist: No    Confirmed patient received a Conservation Officer, Historic Buildings and a Surveyor, Mining with first shipment. The patient will receive a drug information handout for each medication shipped and additional FDA Medication Guides as required.       DISEASE/MEDICATION-SPECIFIC INFORMATION        N/A    SPECIALTY MEDICATION ADHERENCE     Medication Adherence    Patient reported X missed doses in the last month: 0  Specialty Medication: VEMLIDY  25 mg Tab tablet (tenofovir  alafenamide)  Patient is on additional specialty medications: No  Informant: patient              Were doses missed due to medication being on hold? No     VEMLIDY  25 mg Tab tablet (tenofovir  alafenamide): 8-9 days of medicine on hand       Specialty medication is an injection or given on a cycle: No    REFERRAL TO PHARMACIST     Referral to the pharmacist: Not needed      [facility]     Shipping address confirmed in Epic.     Cost and Payment: Patient has a $0 copay, payment information is not required.    Delivery Scheduled: Yes, Expected medication delivery date: 1/21.     Medication will be delivered via Next Day Courier to the prescription address in Epic WAM.    Alison Clark [facility]

## 2024-06-21 MED FILL — VEMLIDY 25 MG TABLET: ORAL | 30 days supply | Qty: 30 | Fill #5
